# Patient Record
Sex: Male | Born: 1950 | Marital: Married
Health system: Southern US, Community
[De-identification: ages and names within clinical notes are randomized; demographics above are authoritative.]

---

## 2004-09-08 ENCOUNTER — Emergency Department: Payer: Self-pay | Admitting: Emergency Medicine

## 2004-09-09 ENCOUNTER — Emergency Department: Payer: Self-pay | Admitting: Emergency Medicine

## 2006-01-20 ENCOUNTER — Ambulatory Visit: Payer: Self-pay | Admitting: Vascular Surgery

## 2006-04-14 ENCOUNTER — Ambulatory Visit: Payer: Self-pay

## 2007-01-03 ENCOUNTER — Ambulatory Visit: Payer: Self-pay | Admitting: Gastroenterology

## 2009-09-02 ENCOUNTER — Ambulatory Visit: Payer: Self-pay | Admitting: Gastroenterology

## 2009-09-04 LAB — PATHOLOGY REPORT

## 2009-09-20 ENCOUNTER — Ambulatory Visit: Payer: Self-pay | Admitting: Internal Medicine

## 2010-11-02 ENCOUNTER — Ambulatory Visit: Payer: Self-pay | Admitting: Internal Medicine

## 2010-11-05 ENCOUNTER — Inpatient Hospital Stay: Payer: Self-pay | Admitting: Internal Medicine

## 2010-11-14 ENCOUNTER — Ambulatory Visit: Payer: Self-pay | Admitting: Internal Medicine

## 2010-11-19 ENCOUNTER — Ambulatory Visit: Payer: Self-pay | Admitting: Internal Medicine

## 2010-11-20 ENCOUNTER — Ambulatory Visit: Payer: Self-pay | Admitting: Internal Medicine

## 2010-12-20 ENCOUNTER — Ambulatory Visit: Payer: Self-pay | Admitting: Internal Medicine

## 2011-01-14 ENCOUNTER — Ambulatory Visit: Payer: Self-pay | Admitting: Internal Medicine

## 2011-01-20 ENCOUNTER — Ambulatory Visit: Payer: Self-pay | Admitting: Internal Medicine

## 2011-01-23 LAB — CBC CANCER CENTER
Basophil %: 0.7 %
Eosinophil #: 0.1 x10 3/mm (ref 0.0–0.7)
HCT: 39.4 % — ABNORMAL LOW (ref 40.0–52.0)
HGB: 13.6 g/dL (ref 13.0–18.0)
Lymphocyte #: 1.9 x10 3/mm (ref 1.0–3.6)
MCH: 29.5 pg (ref 26.0–34.0)
MCHC: 34.6 g/dL (ref 32.0–36.0)
Monocyte #: 0.8 x10 3/mm — ABNORMAL HIGH (ref 0.0–0.7)
Neutrophil #: 3.5 x10 3/mm (ref 1.4–6.5)
Neutrophil %: 54.5 %
RDW: 13.6 % (ref 11.5–14.5)
WBC: 6.5 x10 3/mm (ref 3.8–10.6)

## 2011-01-23 LAB — BASIC METABOLIC PANEL
BUN: 26 mg/dL — ABNORMAL HIGH (ref 7–18)
Calcium, Total: 9.1 mg/dL (ref 8.5–10.1)
Chloride: 103 mmol/L (ref 98–107)
Co2: 30 mmol/L (ref 21–32)
Creatinine: 1.34 mg/dL — ABNORMAL HIGH (ref 0.60–1.30)
EGFR (Non-African Amer.): 58 — ABNORMAL LOW
Osmolality: 284 (ref 275–301)
Potassium: 4.1 mmol/L (ref 3.5–5.1)
Sodium: 140 mmol/L (ref 136–145)

## 2011-01-23 LAB — HEPATIC FUNCTION PANEL A (ARMC)
Alkaline Phosphatase: 95 U/L (ref 50–136)
Bilirubin, Direct: 0.1 mg/dL (ref 0.00–0.20)
Bilirubin,Total: 0.3 mg/dL (ref 0.2–1.0)
SGOT(AST): 24 U/L (ref 15–37)

## 2011-01-30 LAB — CBC CANCER CENTER
Eosinophil #: 0.1 x10 3/mm (ref 0.0–0.7)
Lymphocyte #: 1.4 x10 3/mm (ref 1.0–3.6)
MCH: 29.4 pg (ref 26.0–34.0)
MCHC: 34.7 g/dL (ref 32.0–36.0)
MCV: 85 fL (ref 80–100)
Monocyte #: 0.2 x10 3/mm (ref 0.0–0.7)
Neutrophil %: 36.7 %
Platelet: 192 x10 3/mm (ref 150–440)
RBC: 4.46 10*6/uL (ref 4.40–5.90)

## 2011-02-13 LAB — CBC CANCER CENTER
Basophil #: 0 x10 3/mm (ref 0.0–0.1)
Eosinophil #: 0 x10 3/mm (ref 0.0–0.7)
Eosinophil %: 0 %
HCT: 35.3 % — ABNORMAL LOW (ref 40.0–52.0)
Lymphocyte #: 0.6 x10 3/mm — ABNORMAL LOW (ref 1.0–3.6)
MCHC: 34.6 g/dL (ref 32.0–36.0)
MCV: 86 fL (ref 80–100)
Monocyte #: 0 x10 3/mm (ref 0.0–0.7)
Monocyte %: 0.3 %
Neutrophil #: 3.6 x10 3/mm (ref 1.4–6.5)
Platelet: 276 x10 3/mm (ref 150–440)
RBC: 4.12 10*6/uL — ABNORMAL LOW (ref 4.40–5.90)
RDW: 14.8 % — ABNORMAL HIGH (ref 11.5–14.5)
WBC: 4.3 x10 3/mm (ref 3.8–10.6)

## 2011-02-13 LAB — BASIC METABOLIC PANEL
Anion Gap: 11 (ref 7–16)
BUN: 22 mg/dL — ABNORMAL HIGH (ref 7–18)
Chloride: 101 mmol/L (ref 98–107)
Co2: 26 mmol/L (ref 21–32)
Creatinine: 1.29 mg/dL (ref 0.60–1.30)
EGFR (Non-African Amer.): >60
Glucose: 198 mg/dL — ABNORMAL HIGH (ref 65–99)
Osmolality: 285 (ref 275–301)
Potassium: 3.9 mmol/L (ref 3.5–5.1)
Sodium: 138 mmol/L (ref 136–145)

## 2011-02-20 ENCOUNTER — Ambulatory Visit: Payer: Self-pay | Admitting: Internal Medicine

## 2011-02-20 LAB — BASIC METABOLIC PANEL
Anion Gap: 10 (ref 7–16)
BUN: 21 mg/dL — ABNORMAL HIGH (ref 7–18)
Chloride: 101 mmol/L (ref 98–107)
Creatinine: 1.34 mg/dL — ABNORMAL HIGH (ref 0.60–1.30)
EGFR (African American): >60
EGFR (Non-African Amer.): 58 — ABNORMAL LOW
Glucose: 193 mg/dL — ABNORMAL HIGH (ref 65–99)
Sodium: 137 mmol/L (ref 136–145)

## 2011-02-20 LAB — CBC CANCER CENTER
Basophil #: 0 x10 3/mm (ref 0.0–0.1)
Basophil %: 0.2 %
Eosinophil #: 0 x10 3/mm (ref 0.0–0.7)
Lymphocyte %: 15 %
MCH: 29.7 pg (ref 26.0–34.0)
MCHC: 34.7 g/dL (ref 32.0–36.0)
MCV: 85 fL (ref 80–100)
Monocyte #: 0 x10 3/mm (ref 0.0–0.7)
Neutrophil #: 2.2 x10 3/mm (ref 1.4–6.5)
Neutrophil %: 84.5 %
Platelet: 118 x10 3/mm — ABNORMAL LOW (ref 150–440)
RDW: 14 % (ref 11.5–14.5)

## 2011-02-27 LAB — CBC CANCER CENTER
Basophil #: 0 x10 3/mm (ref 0.0–0.1)
Basophil %: 0.3 %
Eosinophil #: 0 x10 3/mm (ref 0.0–0.7)
HCT: 32.2 % — ABNORMAL LOW (ref 40.0–52.0)
Lymphocyte %: 30.6 %
MCH: 29.8 pg (ref 26.0–34.0)
MCHC: 34.6 g/dL (ref 32.0–36.0)
MCV: 86 fL (ref 80–100)
Monocyte #: 0 x10 3/mm (ref 0.0–0.7)
Monocyte %: 0.8 %
Neutrophil %: 68.2 %

## 2011-02-27 LAB — BASIC METABOLIC PANEL
Anion Gap: 11 (ref 7–16)
BUN: 19 mg/dL — ABNORMAL HIGH (ref 7–18)
Chloride: 98 mmol/L (ref 98–107)
EGFR (African American): >60
Osmolality: 279 (ref 275–301)

## 2011-03-03 ENCOUNTER — Ambulatory Visit: Payer: Self-pay | Admitting: Vascular Surgery

## 2011-03-03 LAB — BUN: BUN: 21 mg/dL — ABNORMAL HIGH (ref 7–18)

## 2011-03-03 LAB — CREATININE, SERUM
Creatinine: 1.27 mg/dL (ref 0.60–1.30)
EGFR (African American): >60
EGFR (Non-African Amer.): >60

## 2011-03-06 LAB — CBC CANCER CENTER
Bands: 13 %
Basophil #: 0 x10 3/mm (ref 0.0–0.1)
Eosinophil %: 0.1 %
HCT: 30.6 % — ABNORMAL LOW (ref 40.0–52.0)
Lymphocyte %: 7.6 %
Lymphocytes: 8 %
MCH: 29.8 pg (ref 26.0–34.0)
MCV: 88 fL (ref 80–100)
Monocyte %: 7.9 %
Monocytes: 12 %
Myelocyte: 3 %
NRBC/100 WBC: 1 /100
Neutrophil #: 22.4 x10 3/mm — ABNORMAL HIGH (ref 1.4–6.5)
Platelet: 369 x10 3/mm (ref 150–440)
RBC: 3.48 10*6/uL — ABNORMAL LOW (ref 4.40–5.90)
RDW: 16.1 % — ABNORMAL HIGH (ref 11.5–14.5)
Segmented Neutrophils: 60 %
WBC: 26.5 x10 3/mm — ABNORMAL HIGH (ref 3.8–10.6)

## 2011-03-13 LAB — BASIC METABOLIC PANEL
Anion Gap: 8 (ref 7–16)
Calcium, Total: 9 mg/dL (ref 8.5–10.1)
Chloride: 100 mmol/L (ref 98–107)
Co2: 28 mmol/L (ref 21–32)
EGFR (African American): >60
Osmolality: 278 (ref 275–301)

## 2011-03-13 LAB — CBC CANCER CENTER
Basophil %: 0.3 %
Eosinophil #: 0 x10 3/mm (ref 0.0–0.7)
Eosinophil %: 0.5 %
HGB: 10.5 g/dL — ABNORMAL LOW (ref 13.0–18.0)
MCH: 29.8 pg (ref 26.0–34.0)
MCHC: 33.7 g/dL (ref 32.0–36.0)
Monocyte %: 9.4 %
Neutrophil #: 5.7 x10 3/mm (ref 1.4–6.5)
Neutrophil %: 70.9 %
Platelet: 288 x10 3/mm (ref 150–440)
RDW: 16 % — ABNORMAL HIGH (ref 11.5–14.5)
WBC: 8 x10 3/mm (ref 3.8–10.6)

## 2011-03-20 ENCOUNTER — Ambulatory Visit: Payer: Self-pay | Admitting: Internal Medicine

## 2011-03-20 LAB — CBC CANCER CENTER
Basophil #: 0 x10 3/mm (ref 0.0–0.1)
Basophil %: 1.1 %
Eosinophil #: 0 x10 3/mm (ref 0.0–0.7)
Eosinophil %: 0.7 %
HCT: 30.1 % — ABNORMAL LOW (ref 40.0–52.0)
HGB: 10.2 g/dL — ABNORMAL LOW (ref 13.0–18.0)
Lymphocyte #: 1.3 x10 3/mm (ref 1.0–3.6)
MCH: 29.9 pg (ref 26.0–34.0)
MCV: 88 fL (ref 80–100)
Monocyte %: 8.7 %
RBC: 3.43 10*6/uL — ABNORMAL LOW (ref 4.40–5.90)
WBC: 3.1 x10 3/mm — ABNORMAL LOW (ref 3.8–10.6)

## 2011-03-20 LAB — BASIC METABOLIC PANEL
Anion Gap: 12 (ref 7–16)
Calcium, Total: 9 mg/dL (ref 8.5–10.1)
Chloride: 102 mmol/L (ref 98–107)
Co2: 25 mmol/L (ref 21–32)
Creatinine: 1.16 mg/dL (ref 0.60–1.30)
EGFR (African American): >60
EGFR (Non-African Amer.): >60
Glucose: 128 mg/dL — ABNORMAL HIGH (ref 65–99)
Osmolality: 283 (ref 275–301)

## 2011-03-27 LAB — CBC CANCER CENTER
Basophil #: 0 x10 3/mm (ref 0.0–0.1)
Eosinophil #: 0 x10 3/mm (ref 0.0–0.7)
HGB: 9.4 g/dL — ABNORMAL LOW (ref 13.0–18.0)
Lymphocyte #: 1.4 x10 3/mm (ref 1.0–3.6)
Lymphocyte %: 47.9 %
MCH: 30.1 pg (ref 26.0–34.0)
MCHC: 33.9 g/dL (ref 32.0–36.0)
Neutrophil %: 37.5 %
Platelet: 135 x10 3/mm — ABNORMAL LOW (ref 150–440)
RDW: 15.8 % — ABNORMAL HIGH (ref 11.5–14.5)

## 2011-04-03 LAB — BASIC METABOLIC PANEL
Anion Gap: 9 (ref 7–16)
BUN: 15 mg/dL (ref 7–18)
Calcium, Total: 8.9 mg/dL (ref 8.5–10.1)
Co2: 27 mmol/L (ref 21–32)
EGFR (African American): >60
EGFR (Non-African Amer.): >60
Glucose: 114 mg/dL — ABNORMAL HIGH (ref 65–99)
Osmolality: 283 (ref 275–301)
Potassium: 4.1 mmol/L (ref 3.5–5.1)

## 2011-04-03 LAB — CBC CANCER CENTER
Basophil #: 0 x10 3/mm (ref 0.0–0.1)
Eosinophil %: 2.2 %
HCT: 30.6 % — ABNORMAL LOW (ref 40.0–52.0)
HGB: 10.2 g/dL — ABNORMAL LOW (ref 13.0–18.0)
Lymphocyte %: 24.2 %
MCH: 30.5 pg (ref 26.0–34.0)
Monocyte #: 0.9 x10 3/mm — ABNORMAL HIGH (ref 0.0–0.7)
Monocyte %: 16.5 %
Neutrophil #: 3.1 x10 3/mm (ref 1.4–6.5)
RBC: 3.35 10*6/uL — ABNORMAL LOW (ref 4.40–5.90)
RDW: 18.3 % — ABNORMAL HIGH (ref 11.5–14.5)
WBC: 5.5 x10 3/mm (ref 3.8–10.6)

## 2011-04-03 LAB — HEPATIC FUNCTION PANEL A (ARMC)
Bilirubin, Direct: 0 mg/dL (ref 0.00–0.20)
Bilirubin,Total: 0.3 mg/dL (ref 0.2–1.0)
SGPT (ALT): 39 U/L

## 2011-04-03 LAB — MAGNESIUM: Magnesium: 1.6 mg/dL — ABNORMAL LOW

## 2011-04-10 LAB — CBC CANCER CENTER
Basophil %: 0.9 %
Eosinophil %: 2 %
HGB: 9.8 g/dL — ABNORMAL LOW (ref 13.0–18.0)
Lymphocyte %: 49.1 %
MCHC: 33.7 g/dL (ref 32.0–36.0)
MCV: 91 fL (ref 80–100)
Monocyte %: 11.1 %
Neutrophil %: 36.9 %
RBC: 3.22 10*6/uL — ABNORMAL LOW (ref 4.40–5.90)
RDW: 17.8 % — ABNORMAL HIGH (ref 11.5–14.5)
WBC: 3.3 x10 3/mm — ABNORMAL LOW (ref 3.8–10.6)

## 2011-04-20 ENCOUNTER — Ambulatory Visit: Payer: Self-pay | Admitting: Internal Medicine

## 2011-04-22 LAB — CREATININE, SERUM: EGFR (Non-African Amer.): >60

## 2011-04-24 LAB — CBC CANCER CENTER
Basophil #: 0 x10 3/mm (ref 0.0–0.1)
Basophil %: 0.7 %
Eosinophil #: 0.2 x10 3/mm (ref 0.0–0.7)
Eosinophil %: 3.5 %
HCT: 32.9 % — ABNORMAL LOW (ref 40.0–52.0)
HGB: 11 g/dL — ABNORMAL LOW (ref 13.0–18.0)
Lymphocyte %: 28.2 %
MCH: 30.9 pg (ref 26.0–34.0)
Monocyte #: 0.8 x10 3/mm — ABNORMAL HIGH (ref 0.0–0.7)
Monocyte %: 14.7 %
Neutrophil #: 2.9 x10 3/mm (ref 1.4–6.5)
Platelet: 324 x10 3/mm (ref 150–440)
RDW: 18.6 % — ABNORMAL HIGH (ref 11.5–14.5)

## 2011-04-24 LAB — BASIC METABOLIC PANEL
Anion Gap: 10 (ref 7–16)
Chloride: 103 mmol/L (ref 98–107)
Creatinine: 1.26 mg/dL (ref 0.60–1.30)
EGFR (Non-African Amer.): >60
Glucose: 126 mg/dL — ABNORMAL HIGH (ref 65–99)
Osmolality: 284 (ref 275–301)
Potassium: 4 mmol/L (ref 3.5–5.1)
Sodium: 141 mmol/L (ref 136–145)

## 2011-04-24 LAB — HEPATIC FUNCTION PANEL A (ARMC)
Albumin: 3.4 g/dL (ref 3.4–5.0)
Bilirubin,Total: 0.3 mg/dL (ref 0.2–1.0)
SGOT(AST): 20 U/L (ref 15–37)
SGPT (ALT): 27 U/L

## 2011-05-01 LAB — CBC CANCER CENTER
Basophil #: 0 x10 3/mm (ref 0.0–0.1)
Eosinophil %: 1.8 %
HGB: 10.7 g/dL — ABNORMAL LOW (ref 13.0–18.0)
Lymphocyte #: 1.5 x10 3/mm (ref 1.0–3.6)
MCH: 30.9 pg (ref 26.0–34.0)
MCHC: 33 g/dL (ref 32.0–36.0)
Monocyte #: 0.4 x10 3/mm (ref 0.2–1.0)
Monocyte %: 11.2 %
Neutrophil %: 39.3 %
Platelet: 150 x10 3/mm (ref 150–440)
RDW: 17.1 % — ABNORMAL HIGH (ref 11.5–14.5)

## 2011-05-01 LAB — BASIC METABOLIC PANEL
Anion Gap: 10 (ref 7–16)
BUN: 17 mg/dL (ref 7–18)
Co2: 27 mmol/L (ref 21–32)
Creatinine: 1.22 mg/dL (ref 0.60–1.30)
EGFR (African American): >60
Glucose: 127 mg/dL — ABNORMAL HIGH (ref 65–99)
Potassium: 3.8 mmol/L (ref 3.5–5.1)
Sodium: 139 mmol/L (ref 136–145)

## 2011-05-08 LAB — CBC CANCER CENTER
Basophil #: 0 x10 3/mm (ref 0.0–0.1)
Eosinophil %: 0.8 %
HGB: 9.8 g/dL — ABNORMAL LOW (ref 13.0–18.0)
Lymphocyte %: 55.9 %
MCH: 30.3 pg (ref 26.0–34.0)
MCHC: 32.2 g/dL (ref 32.0–36.0)
MCV: 94 fL (ref 80–100)
Monocyte #: 0.2 x10 3/mm (ref 0.2–1.0)
Monocyte %: 8 %
Neutrophil %: 34.5 %
Platelet: 56 x10 3/mm — ABNORMAL LOW (ref 150–440)
RDW: 16.6 % — ABNORMAL HIGH (ref 11.5–14.5)
WBC: 2.1 x10 3/mm — ABNORMAL LOW (ref 3.8–10.6)

## 2011-05-15 LAB — CBC CANCER CENTER
Basophil #: 0 x10 3/mm (ref 0.0–0.1)
Basophil %: 0.4 %
Eosinophil %: 2 %
HCT: 34.2 % — ABNORMAL LOW (ref 40.0–52.0)
Lymphocyte %: 35.5 %
MCHC: 32.4 g/dL (ref 32.0–36.0)
Monocyte #: 0.6 x10 3/mm (ref 0.2–1.0)
Monocyte %: 16.6 %
Neutrophil #: 1.6 x10 3/mm (ref 1.4–6.5)
Neutrophil %: 45.5 %
Platelet: 148 x10 3/mm — ABNORMAL LOW (ref 150–440)

## 2011-05-15 LAB — BASIC METABOLIC PANEL
Chloride: 104 mmol/L (ref 98–107)
Creatinine: 1.18 mg/dL (ref 0.60–1.30)
EGFR (Non-African Amer.): >60
Glucose: 132 mg/dL — ABNORMAL HIGH (ref 65–99)
Sodium: 141 mmol/L (ref 136–145)

## 2011-05-15 LAB — HEPATIC FUNCTION PANEL A (ARMC)
Albumin: 3.7 g/dL (ref 3.4–5.0)
Bilirubin, Direct: 0.1 mg/dL (ref 0.00–0.20)
SGOT(AST): 24 U/L (ref 15–37)
SGPT (ALT): 33 U/L
Total Protein: 7.4 g/dL (ref 6.4–8.2)

## 2011-05-20 ENCOUNTER — Ambulatory Visit: Payer: Self-pay | Admitting: Internal Medicine

## 2011-05-22 LAB — CBC CANCER CENTER
Basophil #: 0 x10 3/mm (ref 0.0–0.1)
HCT: 31.6 % — ABNORMAL LOW (ref 40.0–52.0)
HGB: 10.5 g/dL — ABNORMAL LOW (ref 13.0–18.0)
Lymphocyte #: 1.6 x10 3/mm (ref 1.0–3.6)
Lymphocyte %: 65.3 %
MCHC: 33.2 g/dL (ref 32.0–36.0)
MCV: 93 fL (ref 80–100)
Monocyte %: 12.3 %
Neutrophil #: 0.5 x10 3/mm — ABNORMAL LOW (ref 1.4–6.5)
Neutrophil %: 19.9 %
Platelet: 196 x10 3/mm (ref 150–440)
RDW: 16.4 % — ABNORMAL HIGH (ref 11.5–14.5)

## 2011-05-22 LAB — BASIC METABOLIC PANEL
Anion Gap: 8 (ref 7–16)
BUN: 20 mg/dL — ABNORMAL HIGH (ref 7–18)
Creatinine: 1.21 mg/dL (ref 0.60–1.30)
EGFR (African American): >60
EGFR (Non-African Amer.): >60
Glucose: 132 mg/dL — ABNORMAL HIGH (ref 65–99)
Osmolality: 284 (ref 275–301)

## 2011-05-29 LAB — CBC CANCER CENTER
Basophil #: 0 x10 3/mm (ref 0.0–0.1)
Basophil %: 0.5 %
Eosinophil #: 0.1 x10 3/mm (ref 0.0–0.7)
Eosinophil %: 1.6 %
HCT: 32.1 % — ABNORMAL LOW (ref 40.0–52.0)
HGB: 10.6 g/dL — ABNORMAL LOW (ref 13.0–18.0)
Lymphocyte #: 1.4 x10 3/mm (ref 1.0–3.6)
Lymphocyte %: 35.4 %
MCH: 31.3 pg (ref 26.0–34.0)
MCHC: 33 g/dL (ref 32.0–36.0)
MCV: 95 fL (ref 80–100)
Monocyte %: 18.3 %
Neutrophil #: 1.8 x10 3/mm (ref 1.4–6.5)
Neutrophil %: 44.2 %
Platelet: 218 x10 3/mm (ref 150–440)
RDW: 16.8 % — ABNORMAL HIGH (ref 11.5–14.5)
WBC: 4 x10 3/mm (ref 3.8–10.6)

## 2011-06-12 LAB — COMPREHENSIVE METABOLIC PANEL
Albumin: 3.6 g/dL (ref 3.4–5.0)
Alkaline Phosphatase: 103 U/L (ref 50–136)
BUN: 16 mg/dL (ref 7–18)
Bilirubin,Total: 0.4 mg/dL (ref 0.2–1.0)
Calcium, Total: 8.5 mg/dL (ref 8.5–10.1)
Glucose: 82 mg/dL (ref 65–99)
Potassium: 4.2 mmol/L (ref 3.5–5.1)
SGOT(AST): 24 U/L (ref 15–37)
SGPT (ALT): 27 U/L

## 2011-06-12 LAB — CBC CANCER CENTER
Basophil %: 1.3 %
HCT: 36.4 % — ABNORMAL LOW (ref 40.0–52.0)
HGB: 12 g/dL — ABNORMAL LOW (ref 13.0–18.0)
Lymphocyte #: 1.8 x10 3/mm (ref 1.0–3.6)
MCH: 30.9 pg (ref 26.0–34.0)
MCHC: 33 g/dL (ref 32.0–36.0)
MCV: 94 fL (ref 80–100)
Monocyte %: 16.6 %
Platelet: 238 x10 3/mm (ref 150–440)
RBC: 3.89 10*6/uL — ABNORMAL LOW (ref 4.40–5.90)
WBC: 4.8 x10 3/mm (ref 3.8–10.6)

## 2011-06-19 LAB — CBC CANCER CENTER
Eosinophil #: 0.1 x10 3/mm (ref 0.0–0.7)
Eosinophil %: 1.4 %
HCT: 34.9 % — ABNORMAL LOW (ref 40.0–52.0)
HGB: 11.5 g/dL — ABNORMAL LOW (ref 13.0–18.0)
Lymphocyte #: 1.7 x10 3/mm (ref 1.0–3.6)
MCH: 30.4 pg (ref 26.0–34.0)
MCHC: 32.9 g/dL (ref 32.0–36.0)
MCV: 92 fL (ref 80–100)
Monocyte #: 0.5 x10 3/mm (ref 0.2–1.0)
Neutrophil #: 1.8 x10 3/mm (ref 1.4–6.5)
Neutrophil %: 43 %
RBC: 3.78 10*6/uL — ABNORMAL LOW (ref 4.40–5.90)
WBC: 4.1 x10 3/mm (ref 3.8–10.6)

## 2011-06-19 LAB — BASIC METABOLIC PANEL
Calcium, Total: 8.7 mg/dL (ref 8.5–10.1)
Chloride: 102 mmol/L (ref 98–107)
Creatinine: 1.15 mg/dL (ref 0.60–1.30)
EGFR (African American): >60
EGFR (Non-African Amer.): >60
Glucose: 112 mg/dL — ABNORMAL HIGH (ref 65–99)

## 2011-06-20 ENCOUNTER — Ambulatory Visit: Payer: Self-pay | Admitting: Internal Medicine

## 2011-06-26 LAB — CBC CANCER CENTER
Basophil #: 0 x10 3/mm (ref 0.0–0.1)
Eosinophil #: 0 x10 3/mm (ref 0.0–0.7)
Eosinophil %: 1.1 %
HGB: 11 g/dL — ABNORMAL LOW (ref 13.0–18.0)
Lymphocyte #: 1.2 x10 3/mm (ref 1.0–3.6)
Lymphocyte %: 54.2 %
MCH: 30.6 pg (ref 26.0–34.0)
MCV: 93 fL (ref 80–100)
Monocyte %: 11.1 %
Platelet: 60 x10 3/mm — ABNORMAL LOW (ref 150–440)
RDW: 15.6 % — ABNORMAL HIGH (ref 11.5–14.5)
WBC: 2.2 x10 3/mm — ABNORMAL LOW (ref 3.8–10.6)

## 2011-06-26 LAB — CREATININE, SERUM: EGFR (Non-African Amer.): >60

## 2011-07-10 LAB — BASIC METABOLIC PANEL
BUN: 20 mg/dL — ABNORMAL HIGH (ref 7–18)
Calcium, Total: 8.9 mg/dL (ref 8.5–10.1)
Co2: 27 mmol/L (ref 21–32)
EGFR (African American): >60
EGFR (Non-African Amer.): >60
Glucose: 141 mg/dL — ABNORMAL HIGH (ref 65–99)
Osmolality: 284 (ref 275–301)
Potassium: 3.6 mmol/L (ref 3.5–5.1)
Sodium: 140 mmol/L (ref 136–145)

## 2011-07-10 LAB — CBC CANCER CENTER
Basophil %: 1 %
Eosinophil %: 3.4 %
HGB: 11.7 g/dL — ABNORMAL LOW (ref 13.0–18.0)
MCH: 30.6 pg (ref 26.0–34.0)
MCHC: 32.8 g/dL (ref 32.0–36.0)
MCV: 93 fL (ref 80–100)
Monocyte #: 0.7 x10 3/mm (ref 0.2–1.0)
Platelet: 268 x10 3/mm (ref 150–440)
RBC: 3.82 10*6/uL — ABNORMAL LOW (ref 4.40–5.90)

## 2011-07-20 ENCOUNTER — Ambulatory Visit: Payer: Self-pay | Admitting: Internal Medicine

## 2011-08-14 LAB — CBC CANCER CENTER
Basophil %: 0.5 %
Eosinophil #: 0.2 x10 3/mm (ref 0.0–0.7)
Eosinophil %: 3.7 %
HCT: 36.8 % — ABNORMAL LOW (ref 40.0–52.0)
HGB: 12.5 g/dL — ABNORMAL LOW (ref 13.0–18.0)
Lymphocyte %: 24.6 %
MCHC: 34 g/dL (ref 32.0–36.0)
MCV: 91 fL (ref 80–100)
Neutrophil #: 3.9 x10 3/mm (ref 1.4–6.5)
Neutrophil %: 61 %
Platelet: 265 x10 3/mm (ref 150–440)
RBC: 4.07 10*6/uL — ABNORMAL LOW (ref 4.40–5.90)
WBC: 6.4 x10 3/mm (ref 3.8–10.6)

## 2011-08-14 LAB — BASIC METABOLIC PANEL
Anion Gap: 10 (ref 7–16)
BUN: 22 mg/dL — ABNORMAL HIGH (ref 7–18)
Calcium, Total: 8.9 mg/dL (ref 8.5–10.1)
Chloride: 101 mmol/L (ref 98–107)
Co2: 28 mmol/L (ref 21–32)
Creatinine: 1.59 mg/dL — ABNORMAL HIGH (ref 0.60–1.30)
EGFR (African American): 54 — ABNORMAL LOW
EGFR (Non-African Amer.): 46 — ABNORMAL LOW
Glucose: 101 mg/dL — ABNORMAL HIGH (ref 65–99)
Osmolality: 281 (ref 275–301)

## 2011-08-14 LAB — MAGNESIUM: Magnesium: 1.7 mg/dL — ABNORMAL LOW

## 2011-08-20 ENCOUNTER — Ambulatory Visit: Payer: Self-pay | Admitting: Internal Medicine

## 2011-08-28 LAB — BASIC METABOLIC PANEL
Anion Gap: 9 (ref 7–16)
BUN: 25 mg/dL — ABNORMAL HIGH (ref 7–18)
Calcium, Total: 9 mg/dL (ref 8.5–10.1)
Co2: 28 mmol/L (ref 21–32)
Creatinine: 1.53 mg/dL — ABNORMAL HIGH (ref 0.60–1.30)
EGFR (African American): 56 — ABNORMAL LOW
Glucose: 106 mg/dL — ABNORMAL HIGH (ref 65–99)
Sodium: 137 mmol/L (ref 136–145)

## 2011-08-28 LAB — HEPATIC FUNCTION PANEL A (ARMC)
Alkaline Phosphatase: 99 U/L (ref 50–136)
Bilirubin, Direct: 0.1 mg/dL (ref 0.00–0.20)
Bilirubin,Total: 0.7 mg/dL (ref 0.2–1.0)

## 2011-08-28 LAB — CBC CANCER CENTER
Basophil #: 0.1 x10 3/mm (ref 0.0–0.1)
Eosinophil #: 0.2 x10 3/mm (ref 0.0–0.7)
Lymphocyte #: 1.9 x10 3/mm (ref 1.0–3.6)
MCH: 30.6 pg (ref 26.0–34.0)
MCHC: 34.2 g/dL (ref 32.0–36.0)
MCV: 89 fL (ref 80–100)
Monocyte #: 0.7 x10 3/mm (ref 0.2–1.0)
Neutrophil #: 3 x10 3/mm (ref 1.4–6.5)
Neutrophil %: 52.6 %
Platelet: 288 x10 3/mm (ref 150–440)
RDW: 15.1 % — ABNORMAL HIGH (ref 11.5–14.5)
WBC: 5.8 x10 3/mm (ref 3.8–10.6)

## 2011-09-04 LAB — HEPATIC FUNCTION PANEL A (ARMC)
Alkaline Phosphatase: 106 U/L (ref 50–136)
Bilirubin, Direct: 0.1 mg/dL (ref 0.00–0.20)
Bilirubin,Total: 0.7 mg/dL (ref 0.2–1.0)

## 2011-09-04 LAB — CBC CANCER CENTER
Eosinophil #: 0.1 x10 3/mm (ref 0.0–0.7)
Eosinophil %: 2.6 %
HCT: 35.4 % — ABNORMAL LOW (ref 40.0–52.0)
Lymphocyte %: 32.5 %
MCH: 29.2 pg (ref 26.0–34.0)
MCHC: 32.7 g/dL (ref 32.0–36.0)
Monocyte #: 0.6 x10 3/mm (ref 0.2–1.0)
Monocyte %: 12.4 %
Neutrophil #: 2.5 x10 3/mm (ref 1.4–6.5)
Neutrophil %: 51.9 %
Platelet: 279 x10 3/mm (ref 150–440)
RBC: 3.96 10*6/uL — ABNORMAL LOW (ref 4.40–5.90)

## 2011-09-04 LAB — BASIC METABOLIC PANEL
BUN: 25 mg/dL — ABNORMAL HIGH (ref 7–18)
Calcium, Total: 9.3 mg/dL (ref 8.5–10.1)
Chloride: 99 mmol/L (ref 98–107)
Creatinine: 1.69 mg/dL — ABNORMAL HIGH (ref 0.60–1.30)
EGFR (African American): 50 — ABNORMAL LOW
EGFR (Non-African Amer.): 43 — ABNORMAL LOW
Glucose: 110 mg/dL — ABNORMAL HIGH (ref 65–99)
Osmolality: 279 (ref 275–301)
Potassium: 3.9 mmol/L (ref 3.5–5.1)
Sodium: 137 mmol/L (ref 136–145)

## 2011-09-20 ENCOUNTER — Ambulatory Visit: Payer: Self-pay | Admitting: Internal Medicine

## 2011-10-02 LAB — MAGNESIUM: Magnesium: 1.9 mg/dL

## 2011-10-02 LAB — CBC CANCER CENTER
Basophil %: 0.5 %
Eosinophil #: 0.1 x10 3/mm (ref 0.0–0.7)
Eosinophil %: 1.2 %
HCT: 35.4 % — ABNORMAL LOW (ref 40.0–52.0)
HGB: 11.5 g/dL — ABNORMAL LOW (ref 13.0–18.0)
Lymphocyte #: 1.7 x10 3/mm (ref 1.0–3.6)
MCHC: 32.5 g/dL (ref 32.0–36.0)
Monocyte %: 8.9 %
Neutrophil #: 3.2 x10 3/mm (ref 1.4–6.5)
Platelet: 248 x10 3/mm (ref 150–440)
RBC: 4.03 10*6/uL — ABNORMAL LOW (ref 4.40–5.90)
WBC: 5.5 x10 3/mm (ref 3.8–10.6)

## 2011-10-02 LAB — BASIC METABOLIC PANEL
Calcium, Total: 9 mg/dL (ref 8.5–10.1)
Chloride: 103 mmol/L (ref 98–107)
Glucose: 112 mg/dL — ABNORMAL HIGH (ref 65–99)
Osmolality: 281 (ref 275–301)
Potassium: 3.8 mmol/L (ref 3.5–5.1)
Sodium: 139 mmol/L (ref 136–145)

## 2011-10-02 LAB — HEPATIC FUNCTION PANEL A (ARMC)
Alkaline Phosphatase: 103 U/L (ref 50–136)
Bilirubin, Direct: 0.1 mg/dL (ref 0.00–0.20)
Bilirubin,Total: 0.4 mg/dL (ref 0.2–1.0)
SGPT (ALT): 37 U/L (ref 12–78)
Total Protein: 7.6 g/dL (ref 6.4–8.2)

## 2011-10-02 LAB — PHOSPHORUS: Phosphorus: 2.6 mg/dL (ref 2.5–4.9)

## 2011-10-20 ENCOUNTER — Ambulatory Visit: Payer: Self-pay | Admitting: Internal Medicine

## 2011-10-27 ENCOUNTER — Ambulatory Visit: Payer: Self-pay | Admitting: Urology

## 2011-10-27 DIAGNOSIS — I10 Essential (primary) hypertension: Secondary | ICD-10-CM

## 2011-11-02 ENCOUNTER — Ambulatory Visit: Payer: Self-pay | Admitting: Urology

## 2011-11-02 LAB — CREATININE, SERUM
EGFR (African American): 52 — ABNORMAL LOW
EGFR (Non-African Amer.): 45 — ABNORMAL LOW

## 2011-11-12 LAB — HEPATIC FUNCTION PANEL A (ARMC)
Albumin: 3.8 g/dL (ref 3.4–5.0)
Alkaline Phosphatase: 104 U/L (ref 50–136)
Bilirubin, Direct: 0.1 mg/dL (ref 0.00–0.20)
Bilirubin,Total: 0.5 mg/dL (ref 0.2–1.0)
SGOT(AST): 20 U/L (ref 15–37)
SGPT (ALT): 33 U/L (ref 12–78)
Total Protein: 8.4 g/dL — ABNORMAL HIGH (ref 6.4–8.2)

## 2011-11-12 LAB — CBC CANCER CENTER
Basophil #: 0 x10 3/mm (ref 0.0–0.1)
Basophil %: 0.5 %
Eosinophil #: 0.2 x10 3/mm (ref 0.0–0.7)
Lymphocyte %: 24 %
MCH: 27.3 pg (ref 26.0–34.0)
MCHC: 32 g/dL (ref 32.0–36.0)
Monocyte %: 10.2 %
Neutrophil #: 4 x10 3/mm (ref 1.4–6.5)
Neutrophil %: 62.4 %
Platelet: 360 x10 3/mm (ref 150–440)
RDW: 14.8 % — ABNORMAL HIGH (ref 11.5–14.5)

## 2011-11-12 LAB — MAGNESIUM: Magnesium: 2.7 mg/dL — ABNORMAL HIGH

## 2011-11-12 LAB — PHOSPHORUS: Phosphorus: 3.1 mg/dL (ref 2.5–4.9)

## 2011-11-12 LAB — BASIC METABOLIC PANEL
Anion Gap: 10 (ref 7–16)
BUN: 34 mg/dL — ABNORMAL HIGH (ref 7–18)
EGFR (African American): 47 — ABNORMAL LOW
EGFR (Non-African Amer.): 40 — ABNORMAL LOW
Glucose: 114 mg/dL — ABNORMAL HIGH (ref 65–99)
Osmolality: 282 (ref 275–301)
Potassium: 4.2 mmol/L (ref 3.5–5.1)

## 2011-11-20 ENCOUNTER — Ambulatory Visit: Payer: Self-pay | Admitting: Internal Medicine

## 2011-12-04 LAB — CBC CANCER CENTER
Basophil #: 0 x10 3/mm (ref 0.0–0.1)
Basophil %: 0.6 %
Eosinophil #: 0.2 x10 3/mm (ref 0.0–0.7)
Eosinophil %: 3.5 %
HGB: 11.1 g/dL — ABNORMAL LOW (ref 13.0–18.0)
Lymphocyte #: 1.7 x10 3/mm (ref 1.0–3.6)
MCH: 26.3 pg (ref 26.0–34.0)
MCHC: 31.3 g/dL — ABNORMAL LOW (ref 32.0–36.0)
MCV: 84 fL (ref 80–100)
Monocyte #: 0.6 x10 3/mm (ref 0.2–1.0)
Neutrophil #: 3.7 x10 3/mm (ref 1.4–6.5)
Neutrophil %: 58.8 %
RBC: 4.21 10*6/uL — ABNORMAL LOW (ref 4.40–5.90)
RDW: 15 % — ABNORMAL HIGH (ref 11.5–14.5)

## 2011-12-04 LAB — COMPREHENSIVE METABOLIC PANEL
Albumin: 3.6 g/dL (ref 3.4–5.0)
Alkaline Phosphatase: 103 U/L (ref 50–136)
Anion Gap: 9 (ref 7–16)
Bilirubin,Total: 0.4 mg/dL (ref 0.2–1.0)
Calcium, Total: 9.1 mg/dL (ref 8.5–10.1)
Co2: 27 mmol/L (ref 21–32)
Glucose: 96 mg/dL (ref 65–99)
Potassium: 4.2 mmol/L (ref 3.5–5.1)
SGOT(AST): 20 U/L (ref 15–37)
SGPT (ALT): 24 U/L (ref 12–78)

## 2011-12-04 LAB — PHOSPHORUS: Phosphorus: 2.8 mg/dL (ref 2.5–4.9)

## 2011-12-20 ENCOUNTER — Ambulatory Visit: Payer: Self-pay | Admitting: Internal Medicine

## 2012-01-09 ENCOUNTER — Emergency Department: Payer: Self-pay | Admitting: Emergency Medicine

## 2012-01-09 LAB — URINALYSIS, COMPLETE
Bacteria: NONE SEEN
Bilirubin,UR: NEGATIVE
Blood: NEGATIVE
Ketone: NEGATIVE
Leukocyte Esterase: NEGATIVE
RBC,UR: NONE SEEN /HPF (ref 0–5)
Squamous Epithelial: <1
WBC UR: <1 /HPF (ref 0–5)

## 2012-01-09 LAB — COMPREHENSIVE METABOLIC PANEL
Albumin: 3.8 g/dL (ref 3.4–5.0)
Anion Gap: 5 — ABNORMAL LOW (ref 7–16)
BUN: 17 mg/dL (ref 7–18)
Bilirubin,Total: 0.4 mg/dL (ref 0.2–1.0)
Chloride: 101 mmol/L (ref 98–107)
Co2: 26 mmol/L (ref 21–32)
Creatinine: 1.34 mg/dL — ABNORMAL HIGH (ref 0.60–1.30)
EGFR (African American): >60
EGFR (Non-African Amer.): 57 — ABNORMAL LOW
Glucose: 100 mg/dL — ABNORMAL HIGH (ref 65–99)
Potassium: 4.7 mmol/L (ref 3.5–5.1)
SGOT(AST): 25 U/L (ref 15–37)
SGPT (ALT): 23 U/L (ref 12–78)
Total Protein: 7.8 g/dL (ref 6.4–8.2)

## 2012-01-09 LAB — CBC
HCT: 33.1 % — ABNORMAL LOW (ref 40.0–52.0)
MCH: 25.7 pg — ABNORMAL LOW (ref 26.0–34.0)
MCHC: 31.7 g/dL — ABNORMAL LOW (ref 32.0–36.0)
Platelet: 385 10*3/uL (ref 150–440)
RBC: 4.09 10*6/uL — ABNORMAL LOW (ref 4.40–5.90)
RDW: 14.4 % (ref 11.5–14.5)
WBC: 7 10*3/uL (ref 3.8–10.6)

## 2012-01-14 LAB — HEPATIC FUNCTION PANEL A (ARMC)
Albumin: 3.1 g/dL — ABNORMAL LOW (ref 3.4–5.0)
Alkaline Phosphatase: 95 U/L (ref 50–136)
SGOT(AST): 14 U/L — ABNORMAL LOW (ref 15–37)
Total Protein: 7.1 g/dL (ref 6.4–8.2)

## 2012-01-14 LAB — CBC CANCER CENTER
Eosinophil #: 0.2 x10 3/mm (ref 0.0–0.7)
HCT: 30.2 % — ABNORMAL LOW (ref 40.0–52.0)
Lymphocyte #: 1.4 x10 3/mm (ref 1.0–3.6)
Lymphocyte %: 24.8 %
MCV: 79 fL — ABNORMAL LOW (ref 80–100)
Monocyte #: 0.5 x10 3/mm (ref 0.2–1.0)
Monocyte %: 9.5 %
Neutrophil %: 61.9 %
RBC: 3.81 10*6/uL — ABNORMAL LOW (ref 4.40–5.90)
RDW: 14.7 % — ABNORMAL HIGH (ref 11.5–14.5)
WBC: 5.5 x10 3/mm (ref 3.8–10.6)

## 2012-01-14 LAB — BASIC METABOLIC PANEL
Anion Gap: 7 (ref 7–16)
BUN: 27 mg/dL — ABNORMAL HIGH (ref 7–18)
Chloride: 102 mmol/L (ref 98–107)
Co2: 27 mmol/L (ref 21–32)
Creatinine: 1.58 mg/dL — ABNORMAL HIGH (ref 0.60–1.30)
EGFR (African American): 54 — ABNORMAL LOW
Osmolality: 277 (ref 275–301)
Sodium: 136 mmol/L (ref 136–145)

## 2012-01-14 LAB — MAGNESIUM: Magnesium: 1.9 mg/dL

## 2012-01-20 ENCOUNTER — Ambulatory Visit: Payer: Self-pay | Admitting: Internal Medicine

## 2012-01-21 LAB — CBC CANCER CENTER
Basophil #: 0.1 x10 3/mm (ref 0.0–0.1)
Basophil %: 1.3 %
Eosinophil #: 0.2 x10 3/mm (ref 0.0–0.7)
Eosinophil %: 2.2 %
Lymphocyte #: 2.1 x10 3/mm (ref 1.0–3.6)
Lymphocyte %: 28.3 %
MCH: 25.7 pg — ABNORMAL LOW (ref 26.0–34.0)
MCHC: 32.8 g/dL (ref 32.0–36.0)
Monocyte #: 0.8 x10 3/mm (ref 0.2–1.0)
Monocyte %: 11.2 %
Neutrophil #: 4.3 x10 3/mm (ref 1.4–6.5)
Neutrophil %: 57 %
RBC: 3.96 10*6/uL — ABNORMAL LOW (ref 4.40–5.90)
RDW: 15 % — ABNORMAL HIGH (ref 11.5–14.5)
WBC: 7.5 x10 3/mm (ref 3.8–10.6)

## 2012-01-21 LAB — BASIC METABOLIC PANEL
BUN: 27 mg/dL — ABNORMAL HIGH (ref 7–18)
Calcium, Total: 9.5 mg/dL (ref 8.5–10.1)
Chloride: 100 mmol/L (ref 98–107)
Creatinine: 1.72 mg/dL — ABNORMAL HIGH (ref 0.60–1.30)
EGFR (African American): 49 — ABNORMAL LOW
EGFR (Non-African Amer.): 42 — ABNORMAL LOW
Glucose: 104 mg/dL — ABNORMAL HIGH (ref 65–99)
Osmolality: 276 (ref 275–301)
Potassium: 4.9 mmol/L (ref 3.5–5.1)
Sodium: 135 mmol/L — ABNORMAL LOW (ref 136–145)

## 2012-01-21 LAB — HEPATIC FUNCTION PANEL A (ARMC)
Albumin: 3.4 g/dL (ref 3.4–5.0)
Bilirubin, Direct: 0.1 mg/dL (ref 0.00–0.20)
Bilirubin,Total: 0.4 mg/dL (ref 0.2–1.0)
SGPT (ALT): 28 U/L (ref 12–78)
Total Protein: 7.8 g/dL (ref 6.4–8.2)

## 2012-02-20 ENCOUNTER — Ambulatory Visit: Payer: Self-pay | Admitting: Internal Medicine

## 2012-03-19 DEATH — deceased

## 2013-05-26 IMAGING — CT CT SIM MISC
1 series · 16 of 34 positions shown, 20 images · non-contrast
Comparison: none

[Series 2: — · axial · 1.17mm/px · z∈[-886,-578]mm · 16 of 112 slices shown, 20 images]
[im 9/112  mediastinal]
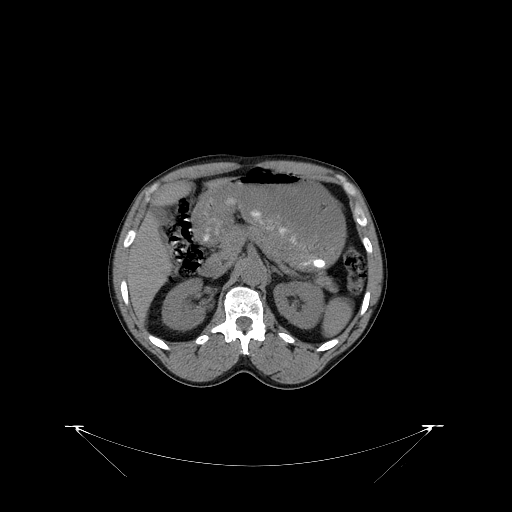
[im 9/112  lung]
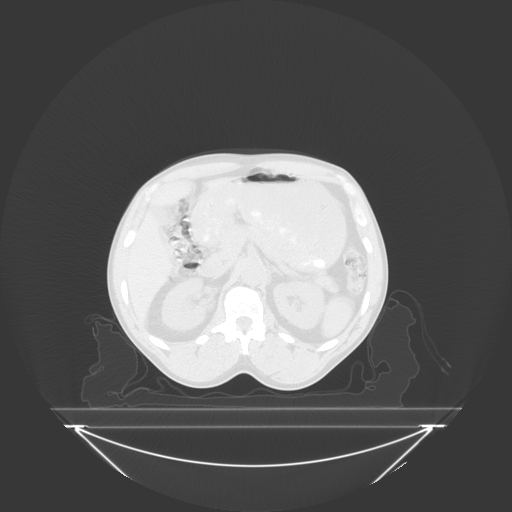
[im 17/112  lung]
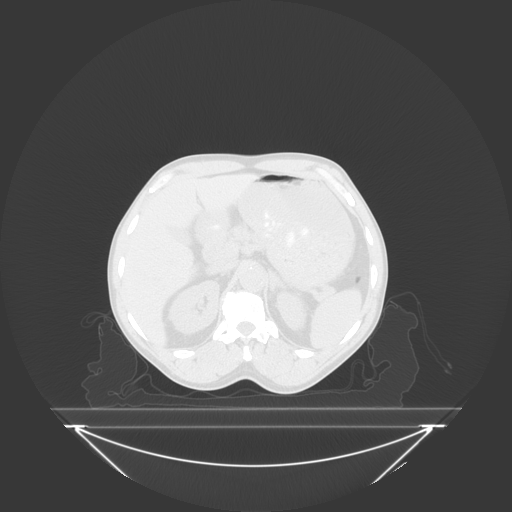
[im 23/112  lung]
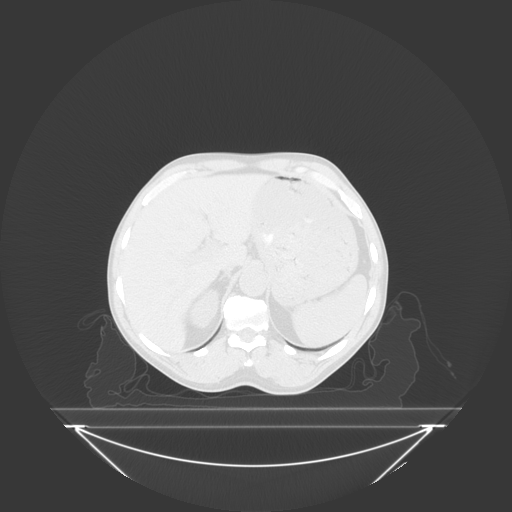
[im 29/112  lung]
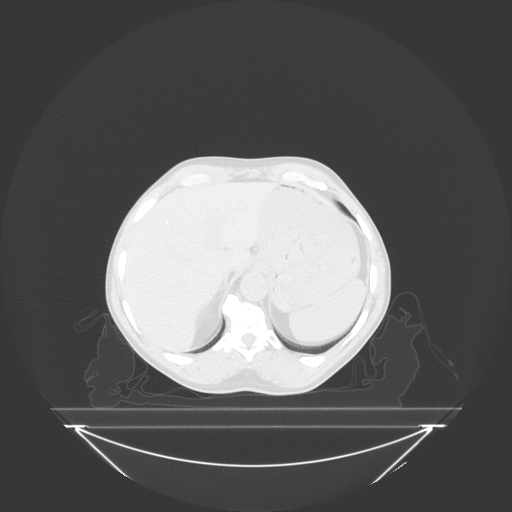
[im 38/112  mediastinal]
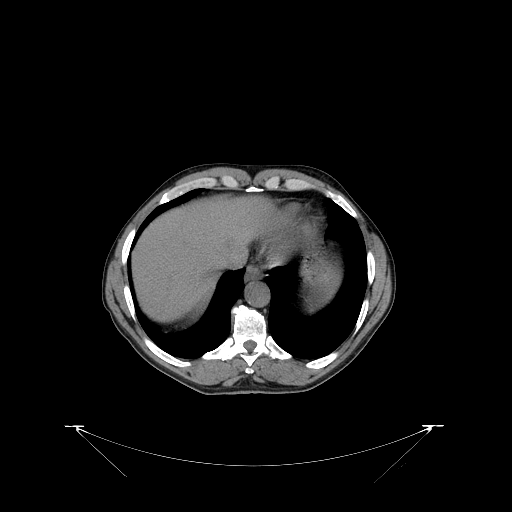
[im 38/112  lung]
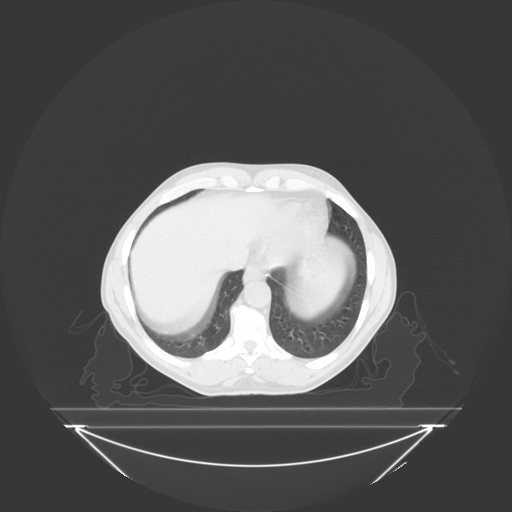
[im 45/112  lung]
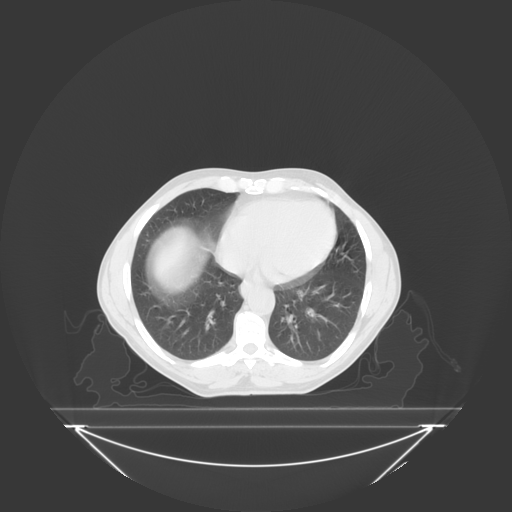
[im 50/112  lung]
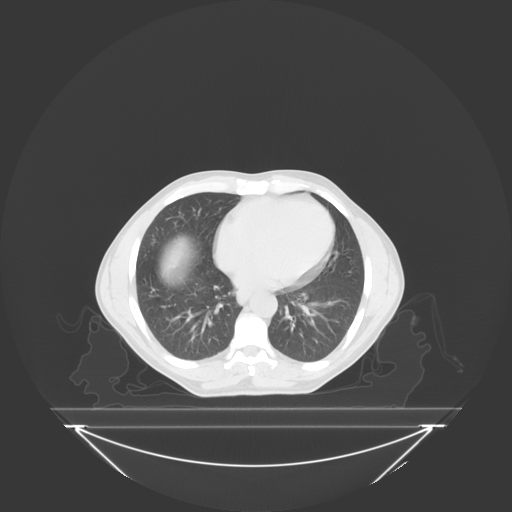
[im 57/112  lung]
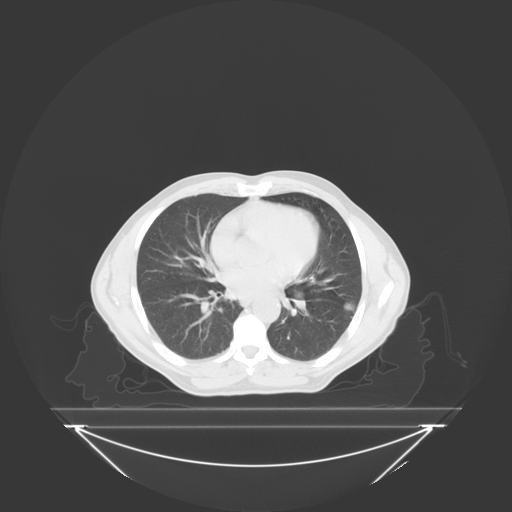
[im 62/112  mediastinal]
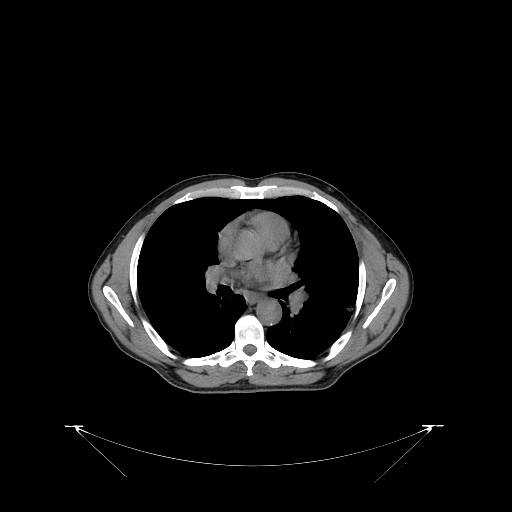
[im 62/112  lung]
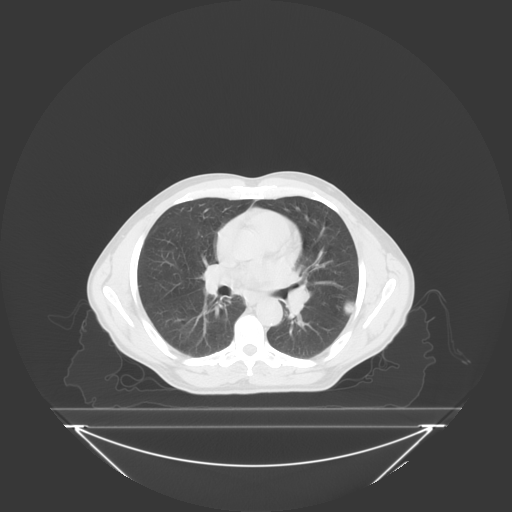
[im 66/112  lung]
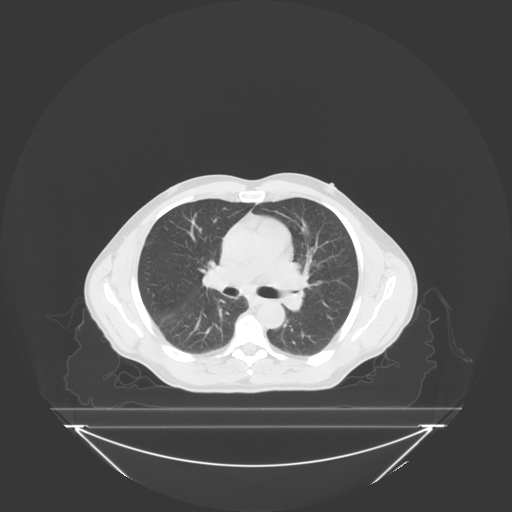
[im 70/112  lung]
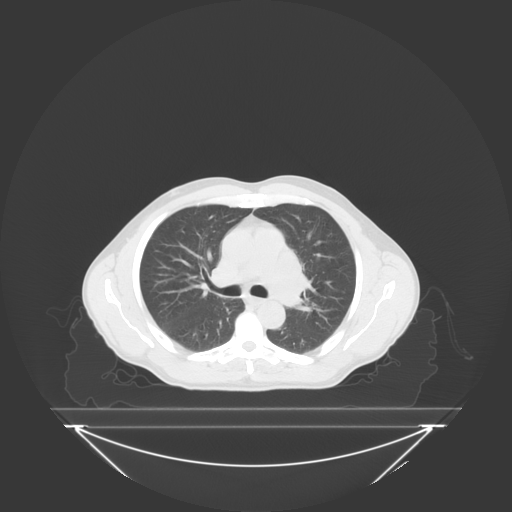
[im 79/112  lung]
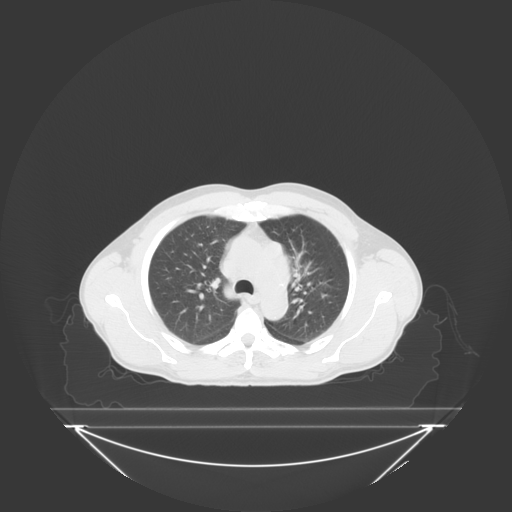
[im 87/112  mediastinal]
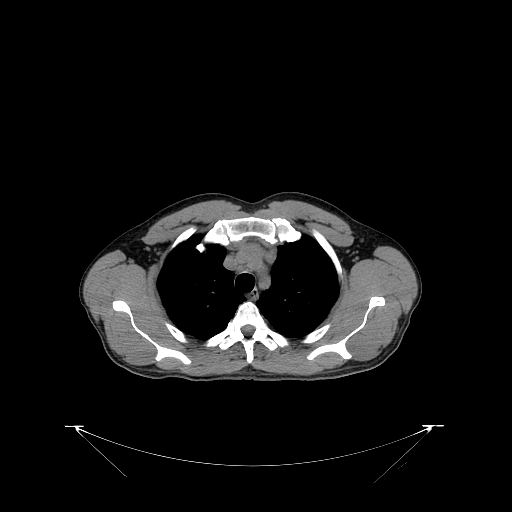
[im 87/112  lung]
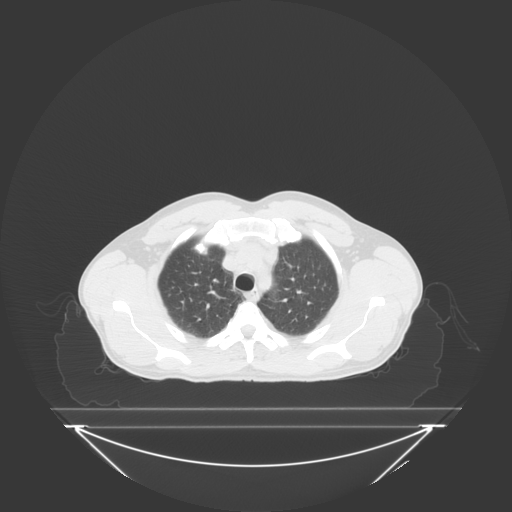
[im 91/112  lung]
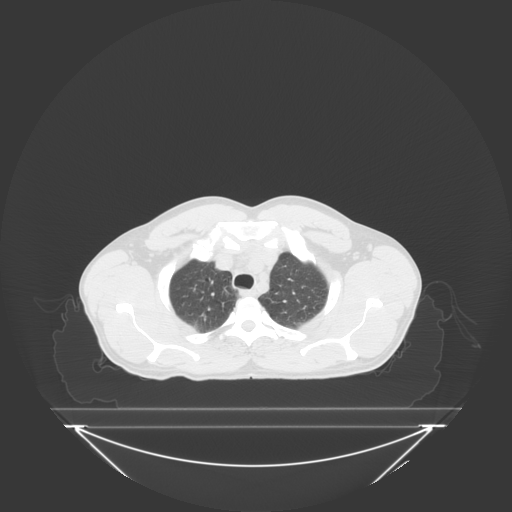
[im 99/112  lung]
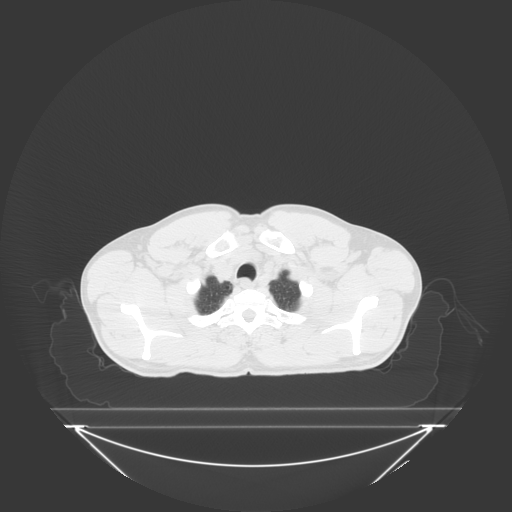
[im 107/112  lung]
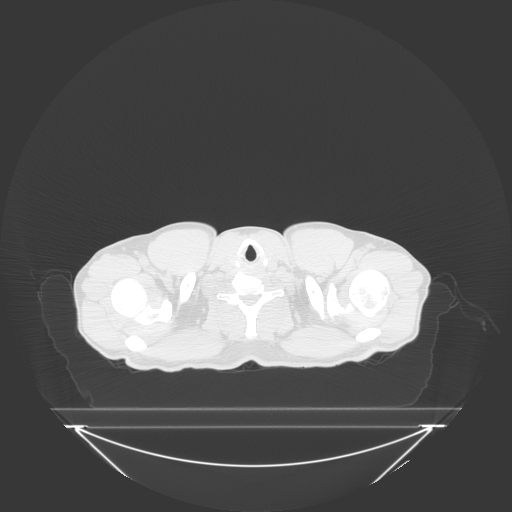

[16 of 34 positions shown; findings below may reference images not displayed]

IMAGES IMPORTED FROM THE [employer] WORKFLOW SYSTEM
NO DICTATION FOR STUDY

## 2013-08-05 IMAGING — CT CT CHEST-ABD W/ CM
1 of 2 series · 13 of 32 positions shown, 18 images · IV contrast (isovue)
Comparison: none

REASON FOR EXAM: Restaging Met Lung CA
COMMENTS:

PROCEDURE:     CT  - CT CHEST AND ABDOMEN W  - February 06, 2011  [DATE]
RESULT:
TECHNIQUE: CT of the chest is performed utilizing 85 ml of Isovue-GRR
iodinated intravenous contrast. The study is continued through the abdomen.
Oral contrast is utilized. Images are reconstructed in the axial plane at
5.0 mm slice thickness and compared to images from a study dated 05 November, 2010.

[Series 2: soft tissue · axial · 0.74mm/px · z∈[-828,-428]mm · 13 of 92 slices shown, 18 images]
[im 6/92  mediastinal]
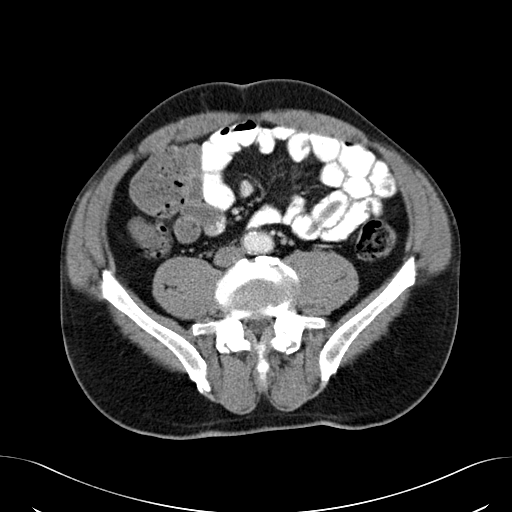
[im 6/92  bone]
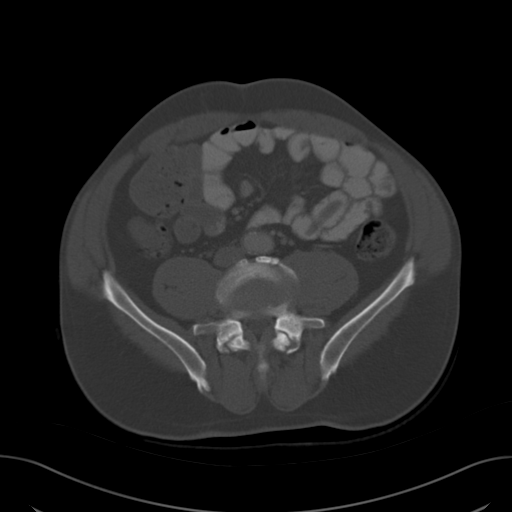
[im 18/92  mediastinal]
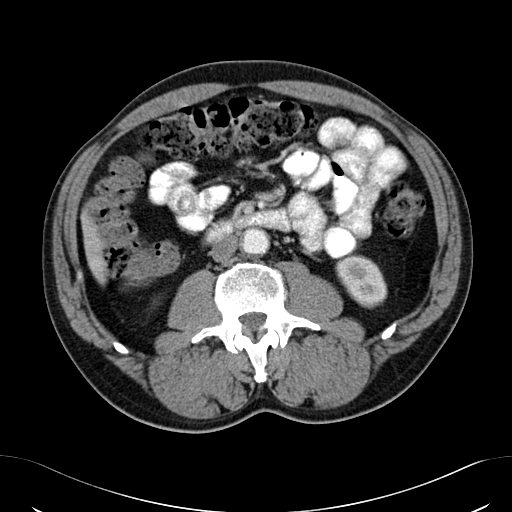
[im 23/92  mediastinal]
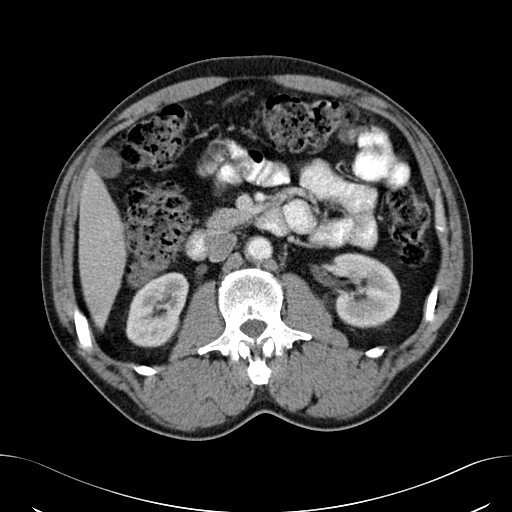
[im 31/92  mediastinal]
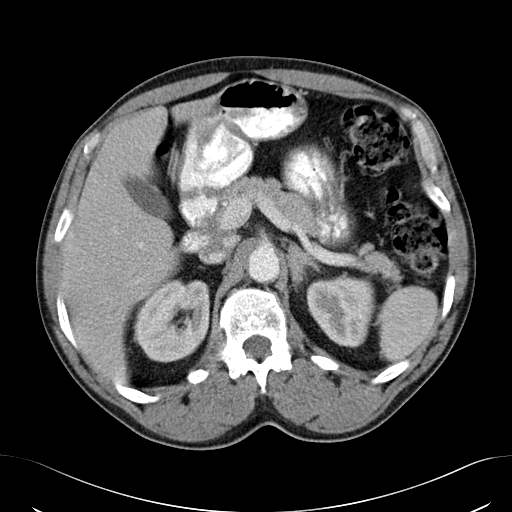
[im 35/92  mediastinal]
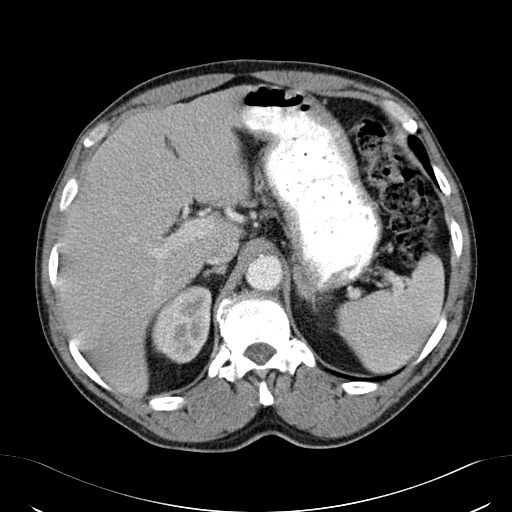
[im 45/92  mediastinal]
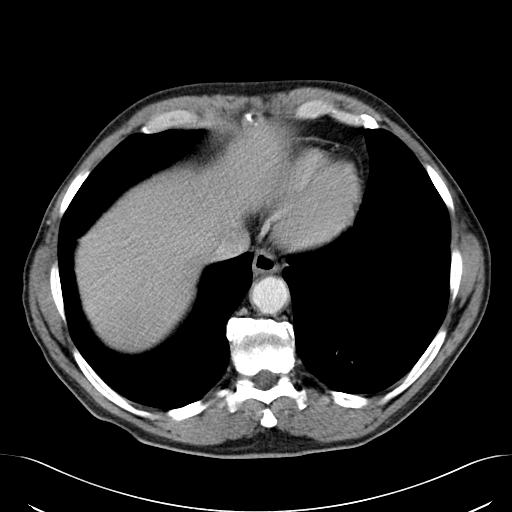
[im 46/92  mediastinal]
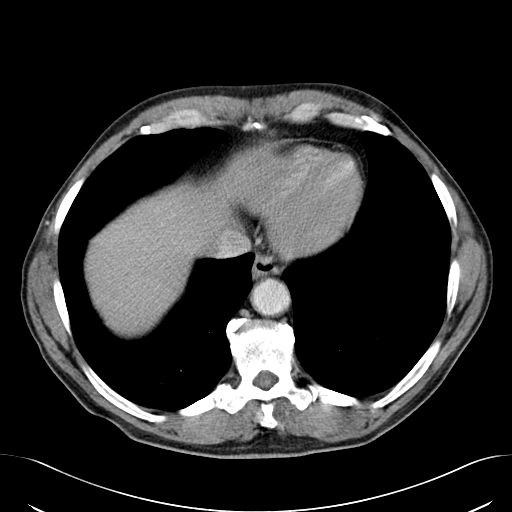
[im 57/92  mediastinal]
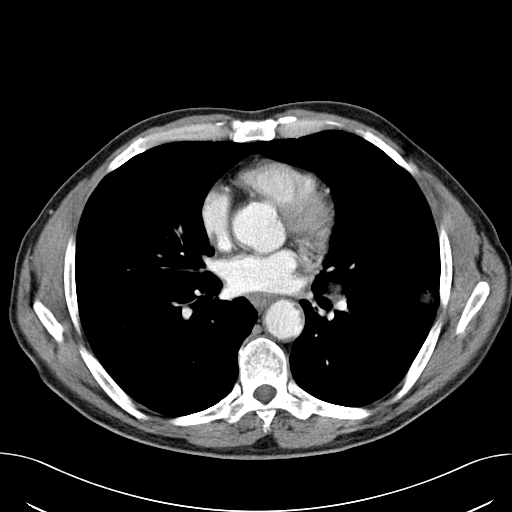
[im 61/92  mediastinal]
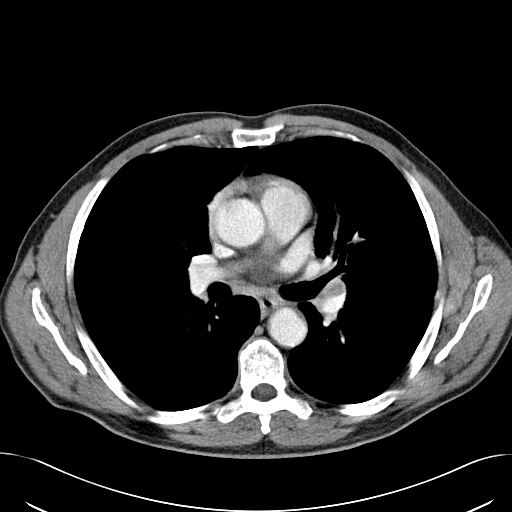
[im 61/92  bone]
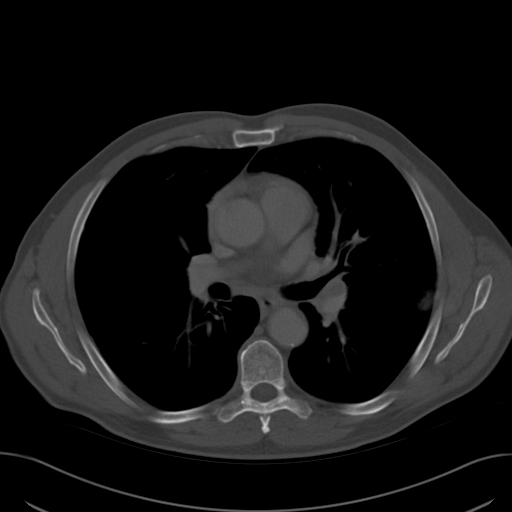
[im 69/92  mediastinal]
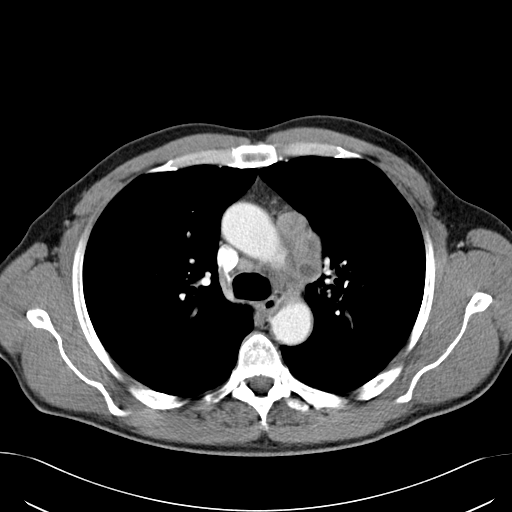
[im 69/92  lung]
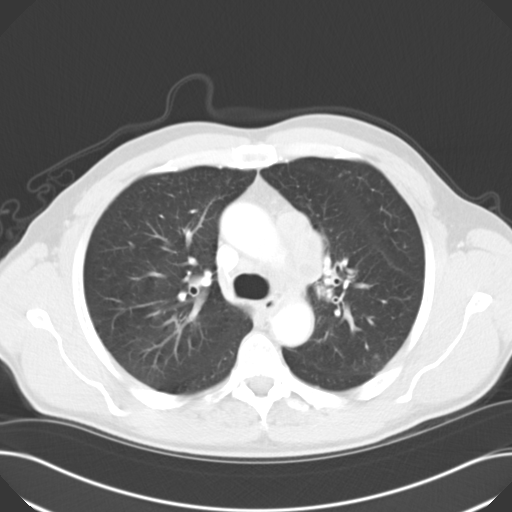
[im 74/92  mediastinal]
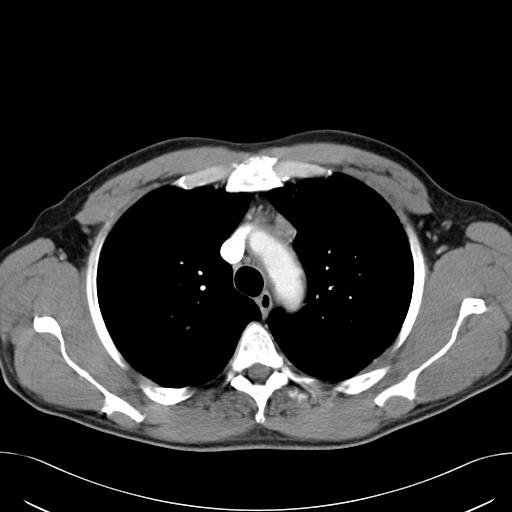
[im 74/92  lung]
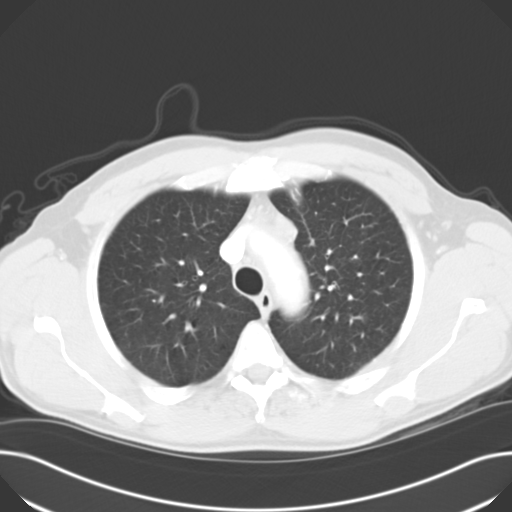
[im 80/92  lung]
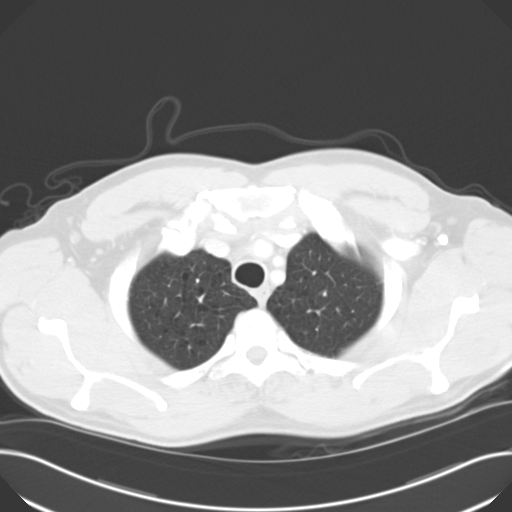
[im 86/92  mediastinal]
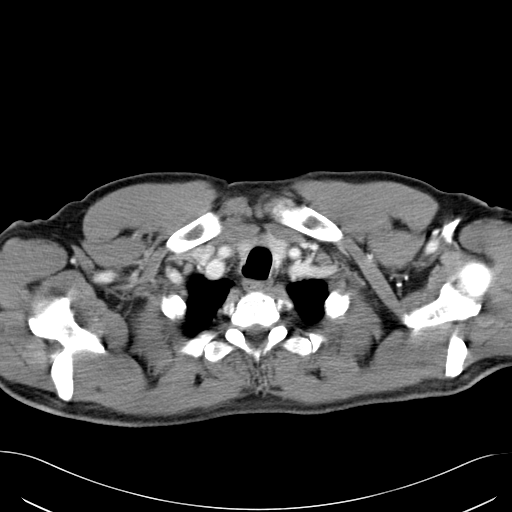
[im 86/92  lung]
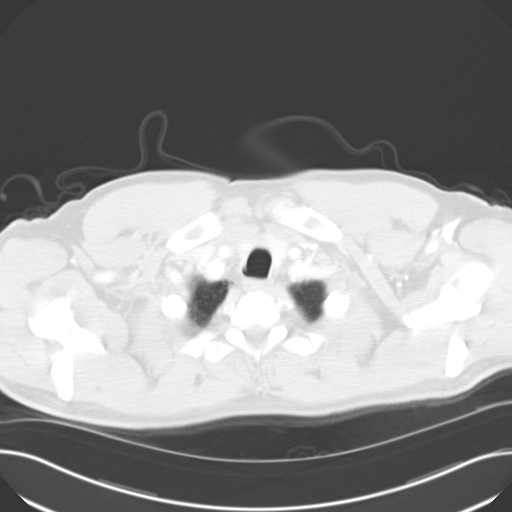

[13 of 32 positions shown; findings below may reference images not displayed]

FINDINGS: A soft tissue nodular density is present in the mediastinum,
lateral to the left main pulmonary artery, showing an anterior to posterior
dimension of 4.28 cm and a medial to lateral dimension of 2.93 cm on image
#26. Right hilar adenopathy on image #27 shows an anterior to posterior
dimension of 2.90 and a medial to lateral dimension of 1.40. These areas
appear to be stable but there is increasing soft tissue density at the level
of the great vessels anteriorly in the paramediastinal region on image #18
showing an anterior to posterior dimension of 2.56 cm and a medial to
lateral dimension of 1.72 cm. Stable pretracheal node has a short axis
measurement of 5.2 mm on image #16. Nodular subpleural density in the left
lung appears stable visually and has AP dimension of 1.99 cm with a medial
to lateral dimension of 1.43 cm as measured on image #34. No new pulmonary
parenchymal mass is appreciated. There is fullness in the left adrenal gland
with evidence of an increase in size compared to the previous images. The
right adrenal appears unremarkable. The kidneys appear within normal limits.
There is no evidence of a focal hepatic or splenic mass. The pancreas is
unremarkable. The visualized aorta and stomach are within normal limits.
There is a moderately large amount of fecal material within the colon.
Correlate for constipation. The appendix appears unremarkable. The small
bowel is within normal limits. No abdominal mesenteric adenopathy is seen.
IMPRESSION: Stable, left lung mass. Stable AP window and hilar
adenopathy with an enlarging area of soft tissue in the superior mediastinal
to paramediastinal region consistent with progression of disease. There is
also slight diffuse enlargement in the left adrenal gland that appears
greater than seen previously.

## 2013-10-19 IMAGING — CT CT CHEST W/ CM
1 series · 15 of 32 positions shown, 19 images · IV contrast (agent unspecified)
Comparison: none

REASON FOR EXAM: [HOSPITAL]  labs  restaging metastatic lung cancer on chemo
COMMENTS:

PROCEDURE:     CT  - CT CHEST WITH CONTRAST  - April 22, 2011  [DATE]
RESULT:     Comparison is made to a prior study 11/05/2010.
TECHNIQUE: Helical 5 mm sections were obtained from the thoracic inlet
through the lung bases status post intravenous administration of 65 mm of
Psovue-K1F.

[Series 2: soft tissue · axial · 0.71mm/px · z∈[-635,-315]mm · 15 of 72 slices shown, 19 images]
[im 5/72  soft-tissue]
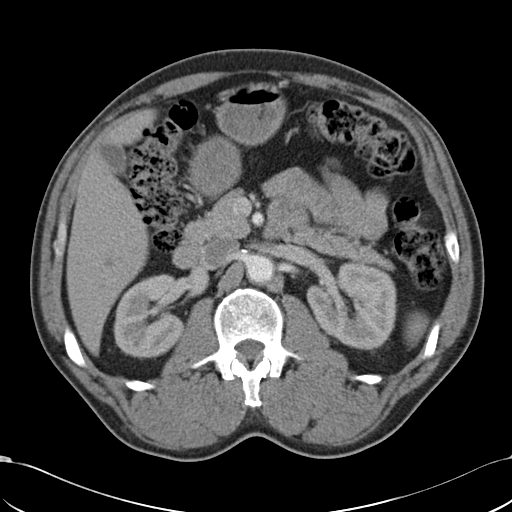
[im 5/72  bone]
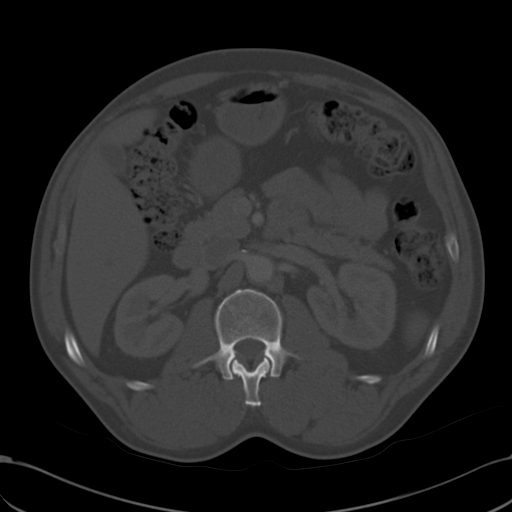
[im 10/72  soft-tissue]
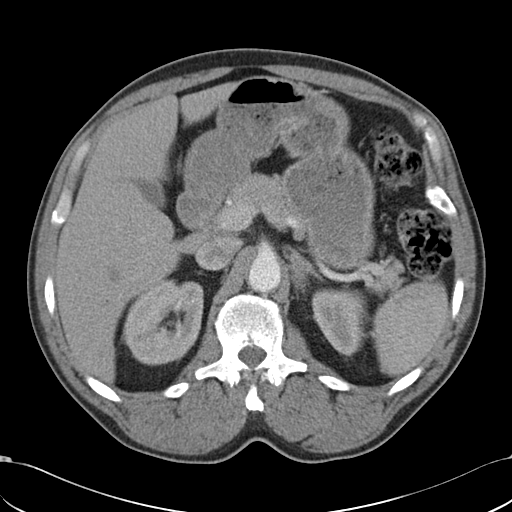
[im 14/72  soft-tissue]
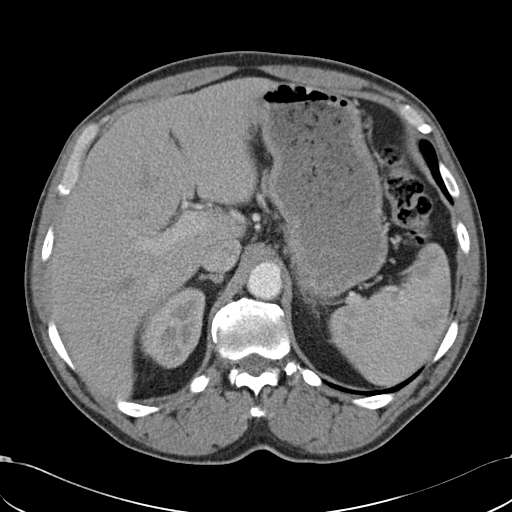
[im 21/72  soft-tissue]
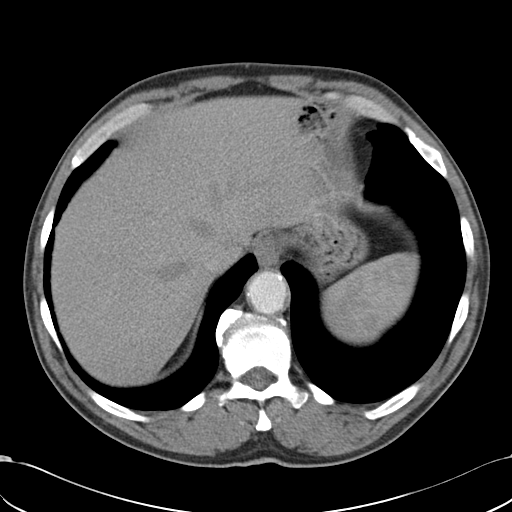
[im 26/72  soft-tissue]
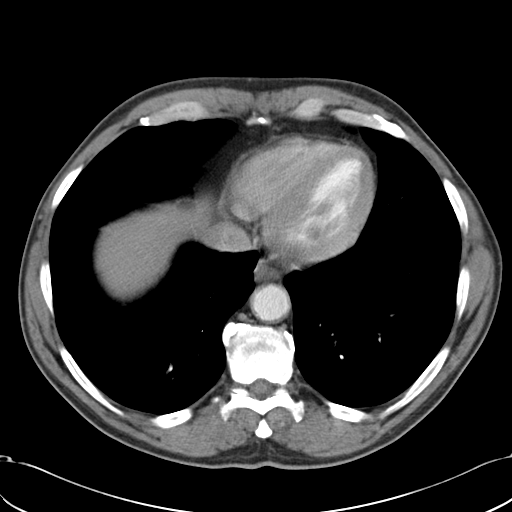
[im 30/72  soft-tissue]
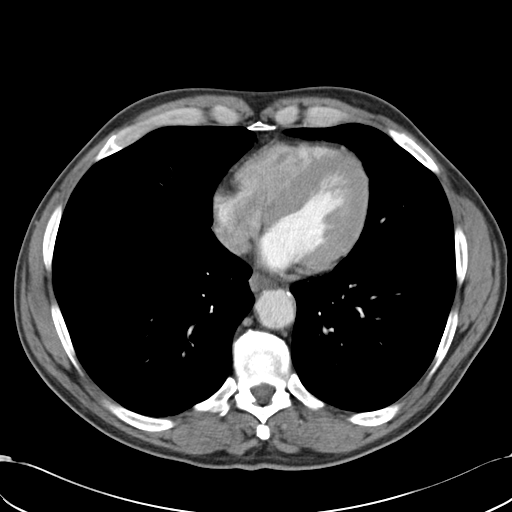
[im 37/72  soft-tissue]
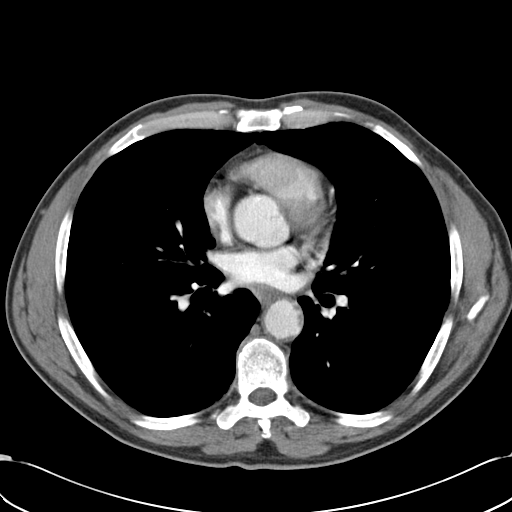
[im 42/72  soft-tissue]
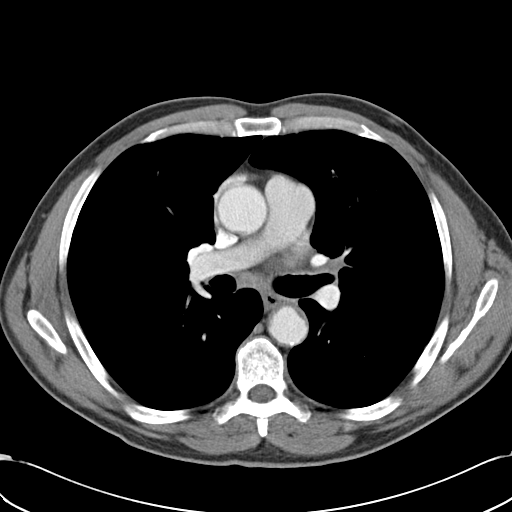
[im 46/72  soft-tissue]
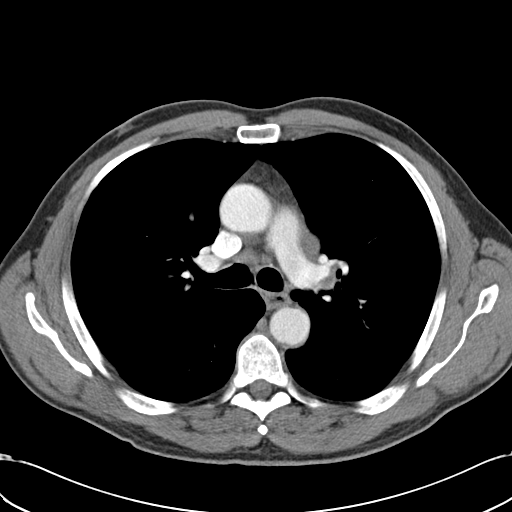
[im 46/72  bone]
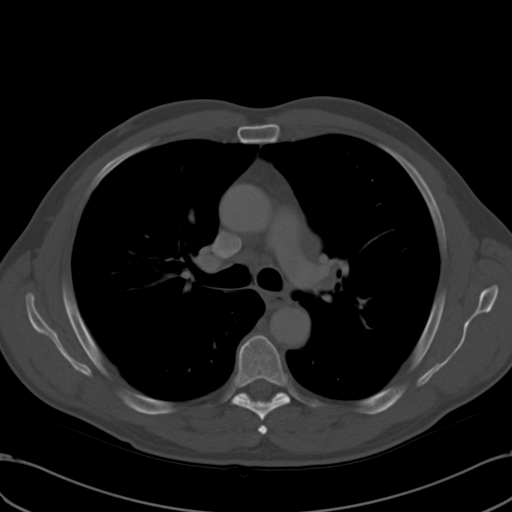
[im 51/72  soft-tissue]
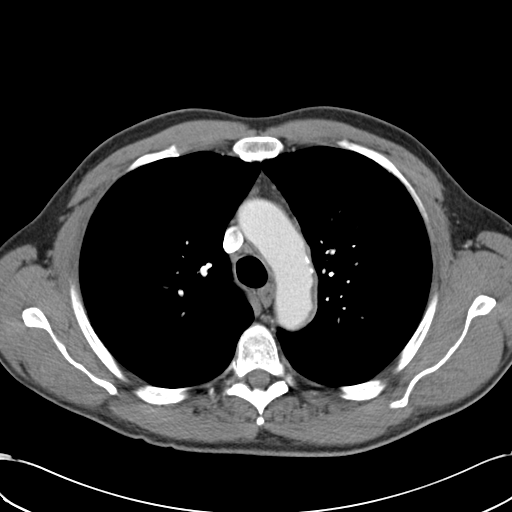
[im 58/72  soft-tissue]
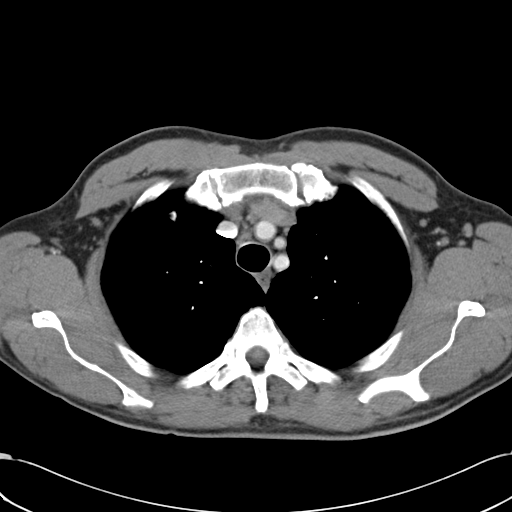
[im 62/72  soft-tissue]
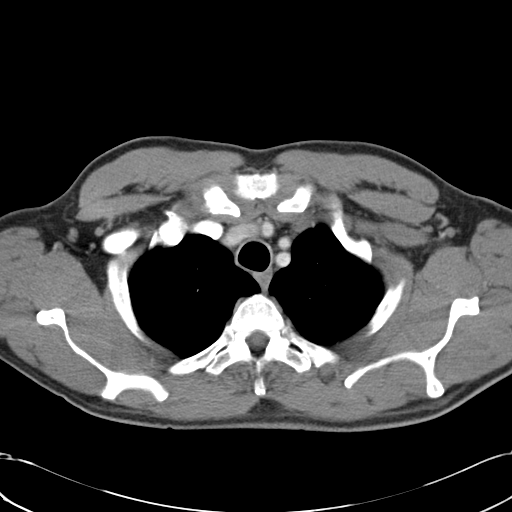
[im 62/72  lung]
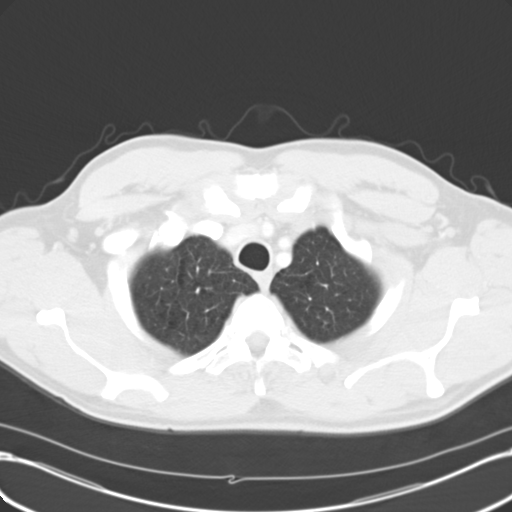
[im 65/72  lung]
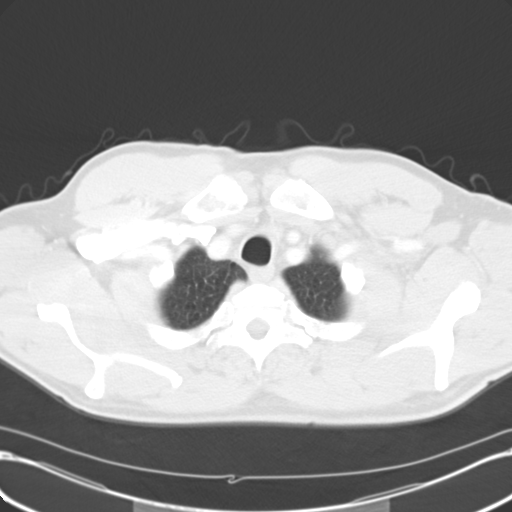
[im 67/72  soft-tissue]
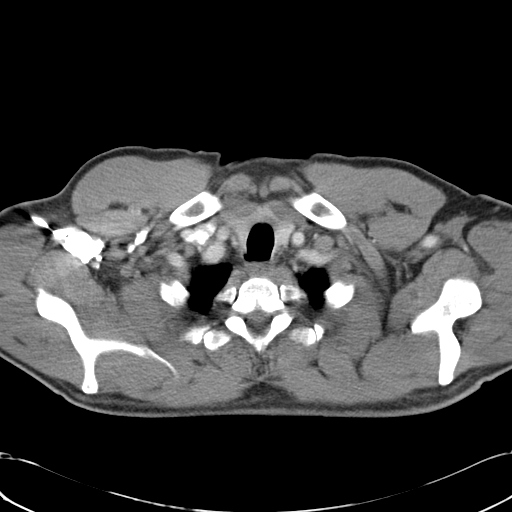
[im 67/72  lung]
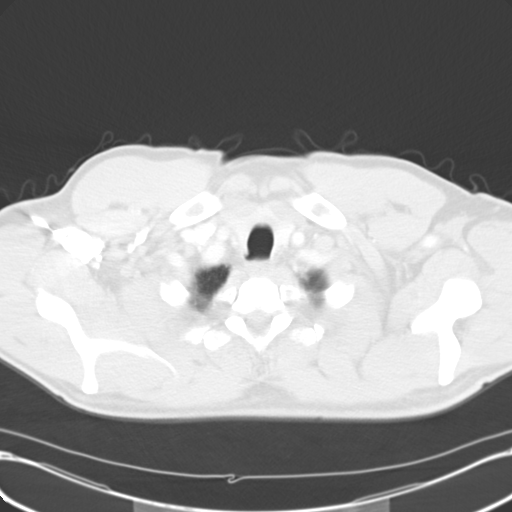
[im 69/72  lung]
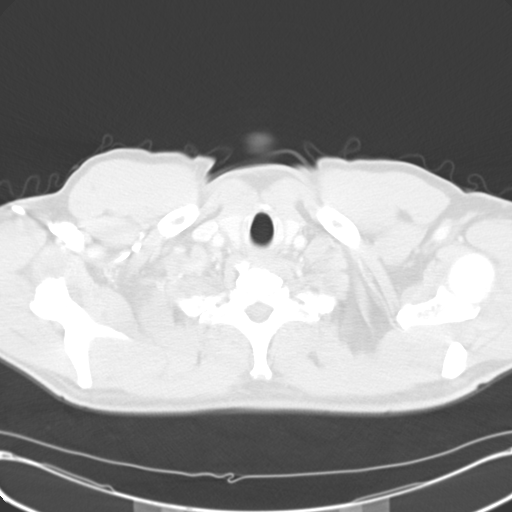

[15 of 32 positions shown; findings below may reference images not displayed]

FINDINGS: Evaluation of the mediastinum, hilar regions and structures
demonstrates decreased size of the mediastinal adenopathy. The largest lymph
node within the AP window previously described has decreased in size
measuring 2.58 x 1.41 cm measured on image #28.

Evaluation of the lung parenchyma demonstrates a 1.41 cm pleural-based
nodule along the posterior periphery of the lingula. This finding is
measured on image 35 on the lung windows and is slightly less prominent when
compared to the previous study. No new pulmonary nodules or masses are
identified. The visualized upper abdominal viscera demonstrates prominence
of the left adrenal. This appears slightly more conspicuous when compared to
the previous study. Otherwise, the visualized upper abdominal viscera are
unremarkable.
IMPRESSION: Decreased adenopathy within the mediastinum with slight decreased
conspicuity of the peripheral left lobe nodule.

## 2014-05-08 NOTE — Op Note (Signed)
PATIENT NAME:  Lance Maldonado, Lance Maldonado MR#:  [id number] DATE OF BIRTH:  1950-03-23  DATE OF PROCEDURE:  11/02/2011  PREOPERATIVE DIAGNOSIS: Erectile dysfunction.   POSTOPERATIVE DIAGNOSIS:  Erectile dysfunction.   PROCEDURE: Placement of a three-piece inflatable penile prosthesis.   SURGEON: Anola GurneyMichael Wolff, M.D.   ANESTHETIST: Dr. Noralyn Pickarroll   ANESTHESIA: General.   INDICATIONS: See the dictated History and Physical. After informed consent, the patient requests the above procedure.   OPERATIVE SUMMARY: After adequate general anesthesia had been obtained, the patient was placed in the supine position and the perineum was scrubbed for a full 15 minutes and then prepped in the usual fashion. A Foley catheter was inserted in sterile fashion. A Scott retractor was placed. A midline raphe incision was made at the penoscrotal junction. Sharp dissection proceeded down to the corpora cavernosa and urethra. The corpora cavernosum were fully exposed. Corporotomy was performed on the left side and the left corpus cavernosum was sequentially dilated up to 13 mm with the Hegar dilators. The left corpus cavernosum was then irrigated with GU irrigant. The procedure was repeated on the right side in an identical fashion. Prior to dilatation, stay sutures were placed into the corpora. The Scott retractor was then removed and the corpora were measured. Total length was 23 cm and the proximal measurement was 10 cm. Therefore the 21-cm cylinder with 2-cm rear tip extender was selected. Using the [facility] System[facility] inserter, the cylinders were placed.  A surrogate reservoir test was performed and excellent erection was noted without curvature, SST deformity, or buckling. The cylinders were then deflated. The left corporotomy then was closed with a running locking 2-0 Vicryl suture. Right corporotomy was closed in a similar fashion. The right transversalis fascia was perforated with a curved Mayo scissors and retropubic space was developed  using finger dissection. The retropubic space was irrigated with GU irrigant. The 100-mL reservoir was then selected and placed into this space. Back pressure test was performed and the reservoir was filled to 90 mL without any back pressure. At this point, a pocket was created in the lower aspect of the right hemiscrotum for the pump. Pump tubing was anchored with a Babcock clamp. The reservoir was then connected to the cylinder assembly with a straight connector. The prosthesis was cycled and excellent rigid and flaccid conditions were noted. The surgical field was irrigated with GU irrigant. Dartos fascia was then closed with a running locking 2-0 Vicryl and the skin was closed with interrupted 3-0 chromic. Sponge, needle, and instrument counts were noted to be correct. Fluffs and scrotal supporter were applied. The procedure was then terminated and the patient was transferred to the recovery room in stable condition.     ____________________________ Suszanne ConnersMichael R. Evelene CroonWolff, MD [id number] D: 11/02/2011 10:08:09 ET T: 11/02/2011 10:47:31 ET JOB#: [id number]  cc: Suszanne ConnersMichael R. Evelene CroonWolff, MD, <Dictator> Orson ApeMICHAEL R WOLFF MD ELECTRONICALLY SIGNED 11/02/2011 12:05

## 2014-05-13 NOTE — Op Note (Signed)
PATIENT NAME:  Lance Maldonado, Lance Maldonado MR#:  [id number] DATE OF BIRTH:  20-Feb-1950  DATE OF PROCEDURE:  03/03/2011  PREOPERATIVE DIAGNOSES:  1. Atherosclerotic occlusive disease bilateral lower extremities.  2. 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  3. Gangrene left fifth toe.   POSTOPERATIVE DIAGNOSES:  1. Atherosclerotic occlusive disease bilateral lower extremities.  2. 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  3. Gangrene left fifth toe.   PROCEDURES PERFORMED:  1. Abdominal aortogram.  2. Left lower extremity distal runoff, third order catheter placement.  3. Percutaneous transluminal angioplasty with stent placement left common iliac artery.   SURGEON: Renford DillsGregory G. Arnie Maiolo, MD   SEDATION: Versed 3 mg plus fentanyl 100 mcg administered IV. Continuous ECG, pulse oximetry and cardiopulmonary monitoring was performed throughout the entire procedure by the Interventional Radiology nurse. Total sedation time was one hour.   ACCESS:  A 6-French sheath right common femoral artery.   CONTRAST USED: 100 mL.   FLUOROSCOPY TIME: 11.5 minutes.   INDICATIONS: Mr. Lance PollHatfield presented to the office with the acute onset of pain in his left foot and cyanotic changes to the left fifth toe as well as mild changes to the left fourth toe. Over the course of the past several weeks, he has progressed and now has gangrene of the fifth toe with ischemic fissuring in the web space between the fourth and fifth toe. Work-up has demonstrated several areas of moderate plaque formation suspicious for ulcerative lesions, one in the iliac system, and one in the superficial femoral artery. He is, therefore, undergoing angiography to identify these as the source with the hope for treatment. The risks and benefits were reviewed. All questions are answered. The patient agrees to proceed.   DESCRIPTION OF PROCEDURE: The patient is taken to Special Procedures and placed in the supine position. After adequate sedation is achieved, both  groins are prepped and draped in sterile fashion. One percent lidocaine is infiltrated into the soft tissues, and access to the right common femoral artery is obtained with a micropuncture needle, a Microwire, followed by a micro sheath, J-wire followed by a 6-French sheath and 6-French pigtail catheter. The pigtail catheter is positioned at the level of T12, and AP projection of the aorta is obtained. After review of the image, the pigtail catheter is repositioned to above the bifurcation, and bilateral oblique views are obtained. Irregularity is noted in the distal half of the left common iliac artery, but it remains somewhat ill defined. Attempts at crossing the bifurcation with the Glidewire demonstrates in this area the wire has significant difficulty in negotiating; however, utilizing the pigtail catheter and the Glidewire torque device, the wire was advanced down into the common femoral and then the superficial femoral artery. The pigtail catheter was then advanced over the wire and positioned in the superficial femoral artery. Initial view in the LAO projection demonstrated the common femoral artery and the femoral bifurcation. A wire was then reintroduced, and a 120 straight slip catheter was advanced into the SFA. It was utilized to perform hand injection. The catheter ultimately was negotiated down to the level of the distal popliteal, and distal runoff was obtained as well. After review of these images, no significant lesion in the SFA is identified. Distal runoff is via the AT and the posterior tibial. The peroneal is small and does not appear to course down to the ankle completely. Therefore, the straight catheter was withdrawn back into the common iliac, and magnified images of the iliac lesion were obtained. They continued to be  suspicious for an ulcerative lesion, and therefore 3000 units of heparin was given. A 6-French Balkan sheath was then advanced up and over the Stiff-Angled Glidewire and  positioned with its tip just crossing the bifurcation in the proximal left common iliac. With a 6-French sheath, injection of contrast was much more complete regarding the imaging, and this did show a shelf-like lesion with an ulcerated area extending from the midportion of the common iliac to the iliac bifurcation. After measuring both the external and the common iliac artery, a 9 x 4 balloon was selected and advanced over the wire. An additional 1000 units of heparin was given. Angioplasty was performed to 10 atmospheres for one minute. Follow-up angiography demonstrated a significant dissection, and therefore a 10 x 30 LifeStar self-expanding stent was deployed across the dissection, postdilated with a 9 balloon. This demonstrated a complete resolution of the lesion with a significant improvement in the appearance and elimination of the dissection. The sheath was then pulled into the external iliac on the right. Oblique view was obtained and a StarClose device deployed successfully.   INTERPRETATION: Images of the abdominal aorta do not demonstrate any significant atherosclerotic changes along the wall. There is no ulcerative lesion identified. The aortic bifurcation is widely patent. Single renal arteries are noted. No evidence of renal artery stenosis. Normal nephrograms are noted. The right common and external/internal iliacs are widely patent. On the left, the common iliac artery demonstrates an irregularity. Ultimately with magnified views, it does demonstrate ulceration with a shelf-like projection. Internal and external iliac arteries appear to be widely patent.   The common femoral and profunda femoris are widely patent. The superficial femoral artery does demonstrate some atherosclerotic changes at Hunter's canal, but they are smooth in appearance and less than 40%. The trifurcation is widely patent, and the anterior tibial and posterior tibial are equal in their flow to the foot. The lateral plantar  appears to be filling but not quite is as well as the dorsalis pedis. Digital vessels are all filled with the exception of the second toe and the fifth toe, which demonstrates significant lack of digital arterial flow. Arch does not appear to be intact with the lateral plantar filling the plantar surface of the foot independently of the dorsalis pedis, which appears to dominantly fill the toes. As noted above, the peroneal does not appear to extend down to the level of the ankle. Although it does not demonstrate any significant atherosclerotic changes, it just appears diminutive in its distal half.   Following magnified views in the oblique projections, angioplasty to 9 mm is performed of the common iliac lesion. This results in a dissection, and subsequently the LifeStar stent is deployed postdilated with an excellent result and complete elimination of the appearance of this lesion as well as illumination of the dissection.   SUMMARY: Successful treatment of an ulcerative plaque-like lesion within the left common iliac artery.  ____________________________ Renford Dills, MD [id number] D: 03/03/2011 10:15:01 ET T: 03/03/2011 11:03:41 ET JOB#: [id number]  cc: Renford Dills, MD, <Dictator> Renford Dills MD ELECTRONICALLY SIGNED 03/16/2011 10:33
